# Patient Record
Sex: Female | Born: 1990 | Race: White | Hispanic: Yes | Marital: Married | Smoking: Former smoker
Health system: Southern US, Community
[De-identification: ages and names within clinical notes are randomized; demographics above are authoritative.]

## PROBLEM LIST (undated history)

## (undated) DIAGNOSIS — O99019 Anemia complicating pregnancy, unspecified trimester: Secondary | ICD-10-CM

## (undated) DIAGNOSIS — K219 Gastro-esophageal reflux disease without esophagitis: Secondary | ICD-10-CM

## (undated) DIAGNOSIS — K8020 Calculus of gallbladder without cholecystitis without obstruction: Secondary | ICD-10-CM

## (undated) HISTORY — PX: APPENDECTOMY: SHX54

---
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
## 2012-06-03 LAB — COMPREHENSIVE METABOLIC PANEL
Albumin: 4.3 g/dL (ref 3.8–5.6)
Alkaline Phosphatase: 102 U/L (ref 82–169)
Anion Gap: 8 (ref 7–16)
BUN: 9 mg/dL (ref 7–18)
Bilirubin,Total: 0.5 mg/dL (ref 0.2–1.0)
Chloride: 109 mmol/L — ABNORMAL HIGH (ref 98–107)
Co2: 24 mmol/L (ref 21–32)
Creatinine: 0.63 mg/dL (ref 0.60–1.30)
EGFR (African American): >60
EGFR (Non-African Amer.): >60
Glucose: 106 mg/dL — ABNORMAL HIGH (ref 65–99)
Potassium: 3.9 mmol/L (ref 3.5–5.1)
SGPT (ALT): 18 U/L (ref 12–78)
Sodium: 141 mmol/L (ref 136–145)
Total Protein: 8.7 g/dL — ABNORMAL HIGH (ref 6.4–8.6)

## 2012-06-03 LAB — CBC
HCT: 41.7 % (ref 35.0–47.0)
HGB: 14 g/dL (ref 12.0–16.0)
MCV: 85 fL (ref 80–100)
Platelet: 237 10*3/uL (ref 150–440)
RDW: 12.6 % (ref 11.5–14.5)

## 2012-06-03 LAB — URINALYSIS, COMPLETE
Glucose,UR: NEGATIVE mg/dL (ref 0–75)
Ph: 6 (ref 4.5–8.0)
Protein: 100
Specific Gravity: 1.027 (ref 1.003–1.030)
WBC UR: 32 /HPF (ref 0–5)

## 2012-06-03 LAB — LIPASE, BLOOD: Lipase: 86 U/L (ref 73–393)

## 2012-06-04 ENCOUNTER — Observation Stay: Payer: Self-pay | Admitting: Surgery

## 2012-06-07 LAB — PATHOLOGY REPORT

## 2012-11-02 ENCOUNTER — Emergency Department: Payer: Self-pay | Admitting: Emergency Medicine

## 2012-11-02 LAB — URINALYSIS, COMPLETE
Bilirubin,UR: NEGATIVE
Blood: NEGATIVE
Glucose,UR: NEGATIVE mg/dL (ref 0–75)
Ketone: NEGATIVE
Nitrite: NEGATIVE
Squamous Epithelial: 2
WBC UR: 9 /HPF (ref 0–5)

## 2012-11-02 LAB — CBC
HCT: 39.5 % (ref 35.0–47.0)
MCH: 28.7 pg (ref 26.0–34.0)
MCV: 86 fL (ref 80–100)
Platelet: 252 10*3/uL (ref 150–440)
RBC: 4.6 10*6/uL (ref 3.80–5.20)
WBC: 10.6 10*3/uL (ref 3.6–11.0)

## 2012-11-02 LAB — COMPREHENSIVE METABOLIC PANEL
Albumin: 4 g/dL (ref 3.8–5.6)
BUN: 11 mg/dL (ref 7–18)
Bilirubin,Total: 0.2 mg/dL (ref 0.2–1.0)
Calcium, Total: 9 mg/dL (ref 9.0–10.7)
Co2: 29 mmol/L (ref 21–32)
Glucose: 86 mg/dL (ref 65–99)
Osmolality: 274 (ref 275–301)
Potassium: 3.5 mmol/L (ref 3.5–5.1)
Sodium: 138 mmol/L (ref 136–145)
Total Protein: 7.8 g/dL (ref 6.4–8.6)

## 2012-11-02 LAB — LIPASE, BLOOD: Lipase: 178 U/L (ref 73–393)

## 2013-11-17 ENCOUNTER — Inpatient Hospital Stay: Payer: Self-pay | Admitting: Obstetrics and Gynecology

## 2013-11-17 LAB — CBC WITH DIFFERENTIAL/PLATELET
BASOS ABS: 0 10*3/uL (ref 0.0–0.1)
Basophil %: 0.3 %
Eosinophil #: 0 10*3/uL (ref 0.0–0.7)
Eosinophil %: 0.4 %
HCT: 38.8 % (ref 35.0–47.0)
HGB: 12.8 g/dL (ref 12.0–16.0)
LYMPHS ABS: 0.8 10*3/uL — AB (ref 1.0–3.6)
Lymphocyte %: 9.5 %
MCH: 28.6 pg (ref 26.0–34.0)
MCHC: 32.9 g/dL (ref 32.0–36.0)
MCV: 87 fL (ref 80–100)
Monocyte #: 0.6 x10 3/mm (ref 0.2–0.9)
Monocyte %: 6.5 %
NEUTROS ABS: 7.4 10*3/uL — AB (ref 1.4–6.5)
Neutrophil %: 83.3 %
Platelet: 157 10*3/uL (ref 150–440)
RBC: 4.47 10*6/uL (ref 3.80–5.20)
RDW: 13.7 % (ref 11.5–14.5)
WBC: 8.9 10*3/uL (ref 3.6–11.0)

## 2013-11-17 LAB — DRUG SCREEN, URINE

## 2013-11-17 LAB — GC/CHLAMYDIA PROBE AMP

## 2013-11-18 LAB — HEMATOCRIT: HCT: 33.5 % — AB (ref 35.0–47.0)

## 2014-08-04 NOTE — H&P (Signed)
Subjective/Chief Complaint 36 hours rlq pain   History of Present Illness 24 y/o female with gradual onset of rlq abdominal pain followed by nausea and emesis thereafter.  no sick contacts.  pain progressive over course of day, developed anorexia.   Past History 2 pregnancies   ALLERGIES:  No Known Allergies:    Other Allergies none   Family and Social History:  Family History Non-Contributory   Social History negative tobacco   Place of Living Home   Review of Systems:  Subjective/Chief Complaint see above   Abdominal Pain Yes   Nausea/Vomiting Yes   Tolerating Diet No  Nauseated  Vomiting   Medications/Allergies Reviewed Medications/Allergies reviewed   Physical Exam:  GEN no acute distress, thin   HEENT pale conjunctivae   NECK supple   RESP normal resp effort  clear BS   CARD regular rate   ABD positive tenderness  no hernia  soft  rovsings sign positive.   LYMPH negative neck   EXTR negative cyanosis/clubbing   SKIN normal to palpation   NEURO cranial nerves intact   PSYCH A+O to time, place, person   Lab Results: Hepatic:  20-Feb-14 13:59   Bilirubin, Total 0.5  Alkaline Phosphatase 102  SGPT (ALT) 18  SGOT (AST) 19  Total Protein, Serum  8.7  Albumin, Serum 4.3  Routine Chem:  20-Feb-14 13:59   Glucose, Serum  106  BUN 9  Creatinine (comp) 0.63  Sodium, Serum 141  Potassium, Serum 3.9  Chloride, Serum  109  CO2, Serum 24  Calcium (Total), Serum 9.3  Osmolality (calc) 280  eGFR (African American) >60  eGFR (Non-African American) >60 (eGFR values <20m/min/1.73 m2 may be an indication of chronic kidney disease (CKD). Calculated eGFR is useful in patients with stable renal function. The eGFR calculation will not be reliable in acutely ill patients when serum creatinine is changing rapidly. It is not useful in  patients on dialysis. The eGFR calculation may not be applicable to patients at the low and high extremes of body sizes,  pregnant women, and vegetarians.)  Anion Gap 8  Lipase 86 (Result(s) reported on 03 Jun 2012 at 02:22PM.)  Routine UA:  20-Feb-14 13:59   Color (UA) Amber  Clarity (UA) Cloudy  Glucose (UA) Negative  Bilirubin (UA) Negative  Ketones (UA) 2+  Specific Gravity (UA) 1.027  Blood (UA) 3+  pH (UA) 6.0  Protein (UA) 100 mg/dL  Nitrite (UA) Negative  Leukocyte Esterase (UA) 2+ (Result(s) reported on 03 Jun 2012 at 03:16PM.)  RBC (UA) 6 /HPF  WBC (UA) 32 /HPF  Bacteria (UA) TRACE  Epithelial Cells (UA) 13 /HPF  Mucous (UA) PRESENT (Result(s) reported on 03 Jun 2012 at 03:16PM.)  Routine Hem:  20-Feb-14 13:59   WBC (CBC)  20.7  RBC (CBC) 4.89  Hemoglobin (CBC) 14.0  Hematocrit (CBC) 41.7  Platelet Count (CBC) 237 (Result(s) reported on 03 Jun 2012 at 02:13PM.)  MCV 85  MCH 28.7  MCHC 33.7  RDW 12.6   Radiology Results: LabUnknown:    20-Feb-14 20:38, CT Abdomen and Pelvis With Contrast  PACS Image  CT:  CT Abdomen and Pelvis With Contrast  REASON FOR EXAM:    (1) RLQ PAIN; (2) RLQ PAIN  COMMENTS:       PROCEDURE: CT  - CT ABDOMEN / PELVIS  W  - Jun 03 2012  8:38PM     RESULT: CT abdomen pelvis dated 06/03/2012.    Technique: Helical 3 mm sections were obtained  from the lung bases   through the pubic symphysis status post intravenous administration of 85   mL Isovue-300 and oral contrast.    Findings: Lung bases are unremarkable. The liver, spleen, adrenals,   pancreas stomach kidneys are unremarkable. Calcified gallstones and   sludge is identified within the gallbladder. There is no CT evidence of   bowel obstruction.  A dilated fluid-filled appendix is identified within the right paracolic   gutter region extending anteriorly and inferiorly from the distal cecum.   And appendicoliths identified within the proximal appendix. There is   stranding within the periappendiceal fat and a trace amount of free   fluid. The appendix measures 1.4 cm in diameter. There is no  evidence of   associated loculated fluid collections.    IMPRESSION:  Findings consistent with appendicitis.        Verified By: Mikki Santee, M.D., MD    Assessment/Admission Diagnosis 24 y/o female with acute appendicitis abnormal UA but no dysuria.   Plan admit, lap appy, discussed with her and husband the procedure and all r/b/a discussed and all questions addressed. IV invanz.scd's   Electronic Signatures: Sherri Rad (MD)  (Signed 20-Feb-14 22:42)  Authored: CHIEF COMPLAINT and HISTORY, ALLERGIES, Other Allergies, FAMILY AND SOCIAL HISTORY, REVIEW OF SYSTEMS, PHYSICAL EXAM, LABS, Radiology, ASSESSMENT AND PLAN   Last Updated: 20-Feb-14 22:42 by Sherri Rad (MD)

## 2014-08-04 NOTE — Op Note (Signed)
PATIENT NAME:  Mary [name], Mary [name] MR#:  [id number] DATE OF BIRTH:  1990-09-12  DATE OF PROCEDURE:  06/04/2012  PREOPERATIVE DIAGNOSIS: Acute appendicitis.   POSTOPERATIVE DIAGNOSIS: Acute appendicitis.  PROCEDURE PERFORMED: Laparoscopic appendectomy.   SURGEON: Roshell Brigham A. Egbert GaribaldiBird, M.D.   ASSISTANT: Surgical Scrub Technologist   ANESTHESIA:  Endotracheal.   ANESTHESIOLOGIST:  Dr. [facility].   SPECIMENS: Appendix.   FINDINGS: Acute appendicitis.   ESTIMATED BLOOD LOSS: Minimal.   DESCRIPTION OF PROCEDURE: With the patient in the supine position, general oral endotracheal anesthesia was induced. Her abdomen was widely prepped and draped with ChloraPrep solution. The left arm was padded and tucked at her side. Timeout was observed. A 12 mm blunt Hassan trocar was placed through a transversely oriented skin incision with stay sutures being passed through the fascia. Pneumoperitoneum was established. A 5 mm bladeless trocar was placed in the right upper quadrant. The patient was then positioned in Trendelenburg and airplane right side up. A 5 mm bladeless trocar was placed in the suprapubic area near the midline. The appendix was then manipulated towards the anterior abdominal wall. The appendix was inflamed with patchy areas of gangrene at the tip. A window was fashioned in the mesoappendix with blunt technique. Utilizing an Endo GIA with bowel load application, the base of the appendix was transected with a lip of cecum.  The mesoappendix was then taken with a single fire of the white load of the stapler. Point hemostasis was obtained, at the staple line, with electrocautery. Specimen was captured in an Endo Catch device and retrieved. The right lower quadrant was then inspected for hemostasis, found to be adequate, irrigated with 500 mL of warm normal saline and aspirated dry. Generalized laparoscopic evaluation of abdomen demonstrated no other acute findings. Gallbladder  appeared to be normal as well as the liver.   Ports were then removed under direct visualization. The infraumbilical fascial defect was closed with an additional figure-of-eight #0 Vicryl suture in vertical orientation with the existing stay sutures tied to each other. A total of 30 mL of 0.25% plain Marcaine was infiltrated along all skin and fascial incisions prior to closure. 4-0 Vicryl subcuticular was applied to all skin edges followed by application of benzoin, Steri-Strips, Telfa and Tegaderm. The patient was then subsequently extubated and taken to the recovery room in stable and satisfactory condition by anesthesia services.   ____________________________ Redge GainerMark A. Egbert GaribaldiBird, MD mab:sb D: 06/04/2012 00:15:10 ET T: 06/04/2012 11:06:45 ET JOB#: [id number]  cc: Loraine LericheMark A. Egbert GaribaldiBird, MD, <Dictator> Raynald KempMARK A Autumn Pruitt MD ELECTRONICALLY SIGNED 06/04/2012 11:41

## 2014-08-22 NOTE — H&P (Signed)
L&D Evaluation:  History:  HPI 24 year old G4 P2012 with EDC=11/15/2013 by a 7wk1d ultrasound presents to L&D at 40 2/7 weeks with c/o onset contractions at 8AM today, becoming more intense at 0930 AM. Denies LOF or VB. [facility] for cholelithiasis (noted on 2014 ultrasound), being RH negative and receiving Rhogam at 28 weeks, having a negative first trimester screen and a normal anatomy scan, and for smoking marijuana during pregnancy. Had positive UDS early in pregnancy and then on 6/8, but then a UDS on 7/20 was negative. NST yesterday was reactive and AFI=7.03cm.  Received TDAP 6/8. LABS : O neg/ VI/RI/ GBS negative. Desires to breastfeed and use Depo for pp birth control.   Presents with contractions   Patient's Medical History Cholelithiasis   Patient's Surgical History appendectomy   Medications Pre Natal Vitamins   Allergies NKDA   Social History drugs  MJ   Family History Non-Contributory   ROS:  ROS see HPI   Exam:  Vital Signs stable  124/71   Urine Protein not completed   General breathing with contractions   Mental Status clear   Chest clear   Heart normal sinus rhythm, no murmur/gallop/rubs   Abdomen gravid, tender with contractions   Estimated Fetal Weight Average for gestational age, 7#   Fetal Position cephalic   Edema no edema   Pelvic no external lesions, 6/80%/-1 to 0   Mebranes Intact   FHT normal rate with no decels, 140 baseline with accels to 170s   FHT Description moderate variability   Ucx regular, q1-5 min apart, inconsistent duration and intensity   Skin dry   Impression:  Impression IUP at 40 2/7 weeks in active labor. 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   Plan:  Plan EFM/NST, monitor contractions and for cervical change, Stadol for pain. Desires epidural if time allows.  UDS   Electronic Signatures: Trinna BalloonGutierrez, Dontrey Snellgrove L (CNM)  (Signed 06-Aug-15 13:14)  Authored: L&D Evaluation   Last Updated: 06-Aug-15 13:14 by  Trinna BalloonGutierrez, Rashad Obeid L (CNM)

## 2014-09-30 ENCOUNTER — Emergency Department: Payer: Medicaid Other

## 2014-09-30 ENCOUNTER — Emergency Department
Admission: EM | Admit: 2014-09-30 | Discharge: 2014-09-30 | Disposition: A | Discharge status: Home / Self Care | Payer: Medicaid Other | Attending: Emergency Medicine | Admitting: Emergency Medicine

## 2014-09-30 ENCOUNTER — Encounter: Payer: Self-pay | Admitting: Emergency Medicine

## 2014-09-30 DIAGNOSIS — O26611 Liver and biliary tract disorders in pregnancy, first trimester: Secondary | ICD-10-CM

## 2014-09-30 DIAGNOSIS — O99611 Diseases of the digestive system complicating pregnancy, first trimester: Secondary | ICD-10-CM | POA: Diagnosis not present

## 2014-09-30 DIAGNOSIS — Z3A01 Less than 8 weeks gestation of pregnancy: Secondary | ICD-10-CM | POA: Diagnosis not present

## 2014-09-30 DIAGNOSIS — R109 Unspecified abdominal pain: Secondary | ICD-10-CM

## 2014-09-30 DIAGNOSIS — R102 Pelvic and perineal pain: Secondary | ICD-10-CM

## 2014-09-30 DIAGNOSIS — O2341 Unspecified infection of urinary tract in pregnancy, first trimester: Secondary | ICD-10-CM | POA: Insufficient documentation

## 2014-09-30 DIAGNOSIS — K8020 Calculus of gallbladder without cholecystitis without obstruction: Secondary | ICD-10-CM | POA: Diagnosis not present

## 2014-09-30 DIAGNOSIS — O9989 Other specified diseases and conditions complicating pregnancy, childbirth and the puerperium: Secondary | ICD-10-CM | POA: Diagnosis present

## 2014-09-30 HISTORY — DX: Calculus of gallbladder without cholecystitis without obstruction: K80.20

## 2014-09-30 LAB — COMPREHENSIVE METABOLIC PANEL
ALT: 13 U/L — ABNORMAL LOW (ref 14–54)
AST: 19 U/L (ref 15–41)
Albumin: 4.5 g/dL (ref 3.5–5.0)
Alkaline Phosphatase: 69 U/L (ref 38–126)
Anion gap: 6 (ref 5–15)
BILIRUBIN TOTAL: 0.4 mg/dL (ref 0.3–1.2)
BUN: 7 mg/dL (ref 6–20)
CHLORIDE: 109 mmol/L (ref 101–111)
CO2: 23 mmol/L (ref 22–32)
Calcium: 9.2 mg/dL (ref 8.9–10.3)
Creatinine, Ser: 0.56 mg/dL (ref 0.44–1.00)
GFR calc Af Amer: >60 mL/min (ref >60)
GFR calc non Af Amer: >60 mL/min (ref >60)
Glucose, Bld: 100 mg/dL — ABNORMAL HIGH (ref 65–99)
Potassium: 3.6 mmol/L (ref 3.5–5.1)
SODIUM: 138 mmol/L (ref 135–145)
TOTAL PROTEIN: 7.8 g/dL (ref 6.5–8.1)

## 2014-09-30 LAB — CBC
HCT: 41.6 % (ref 35.0–47.0)
Hemoglobin: 13.5 g/dL (ref 12.0–16.0)
MCH: 28.7 pg (ref 26.0–34.0)
MCHC: 32.4 g/dL (ref 32.0–36.0)
MCV: 88.6 fL (ref 80.0–100.0)
Platelets: 226 10*3/uL (ref 150–440)
RBC: 4.7 MIL/uL (ref 3.80–5.20)
RDW: 12.2 % (ref 11.5–14.5)
WBC: 14 10*3/uL — ABNORMAL HIGH (ref 3.6–11.0)

## 2014-09-30 LAB — URINALYSIS COMPLETE WITH MICROSCOPIC (ARMC ONLY)
Bilirubin Urine: NEGATIVE
GLUCOSE, UA: NEGATIVE mg/dL
Hgb urine dipstick: NEGATIVE
Nitrite: POSITIVE — AB
Protein, ur: 30 mg/dL — AB
Specific Gravity, Urine: 1.015 (ref 1.005–1.030)
pH: 9 — ABNORMAL HIGH (ref 5.0–8.0)

## 2014-09-30 LAB — HCG, QUANTITATIVE, PREGNANCY: hCG, Beta Chain, Quant, S: 89159 m[IU]/mL — ABNORMAL HIGH (ref <5)

## 2014-09-30 LAB — LIPASE, BLOOD: LIPASE: 27 U/L (ref 22–51)

## 2014-09-30 MED ORDER — METOCLOPRAMIDE HCL 5 MG/ML IJ SOLN
INTRAMUSCULAR | Status: AC
  Filled 2014-09-30: qty 2

## 2014-09-30 MED ORDER — METOCLOPRAMIDE HCL 5 MG/ML IJ SOLN
10.0000 mg | Freq: Once | INTRAMUSCULAR | Status: AC
  Administered 2014-09-30: 10 mg via INTRAVENOUS

## 2014-09-30 MED ORDER — METOCLOPRAMIDE HCL 10 MG PO TABS: 10.0000 mg | ORAL_TABLET | Freq: Three times a day (TID) | ORAL | Status: DC

## 2014-09-30 MED ORDER — NITROFURANTOIN MONOHYD MACRO 100 MG PO CAPS: 100.0000 mg | ORAL_CAPSULE | Freq: Two times a day (BID) | ORAL | Status: AC

## 2014-09-30 MED ORDER — SODIUM CHLORIDE 0.9 % IV BOLUS (SEPSIS)
1000.0000 mL | Freq: Once | INTRAVENOUS | Status: AC
  Administered 2014-09-30: 1000 mL via INTRAVENOUS

## 2014-09-30 NOTE — Discharge Instructions (Signed)

## 2014-09-30 NOTE — ED Notes (Signed)
Patient states she is currently pregnant, unsure of how many weeks but knows LMP was in early May. Patient states she has h/o gall stones. Has had pain at RUQ abdomen with N/V. Has not had OBGYN appt yet.

## 2014-09-30 NOTE — ED Provider Notes (Signed)
[facility] Emergency Department Provider Note ____________________________________________  Time seen: Approximately 3:47 PM  I have reviewed the triage vital signs and the nursing notes.   HISTORY  Chief Complaint Abdominal Pain and Morning Sickness   HPI Mary [name] is a 24 y.o. female who presents to the emergency department for right upper quadrant abdominal pain. She also states that she is pregnant but she is not sure how many weeks. Her last menstrual cycle was sometime in early May. She has a history of gallstones. She states that she is unable to keep any food on her stomach and vomits after eating.   Past Medical History  Diagnosis Date  . Gallstones     There are no active problems to display for this patient.   Past Surgical History  Procedure Laterality Date  . Appendectomy      Current Outpatient Rx  Name  Route  Sig  Dispense  Refill  . metoCLOPramide (REGLAN) 10 MG tablet   Oral   Take 1 tablet (10 mg total) by mouth 3 (three) times daily with meals.   42 tablet   1   . nitrofurantoin, macrocrystal-monohydrate, (MACROBID) 100 MG capsule   Oral   Take 1 capsule (100 mg total) by mouth 2 (two) times daily.   14 capsule   0     Allergies Review of patient's allergies indicates no known allergies.  No family history on file.  Social History History  Substance Use Topics  . Smoking status: Never Smoker   . Smokeless tobacco: Never Used  . Alcohol Use: No    Review of Systems Constitutional: No fever/chills Eyes: No visual changes. ENT: No sore throat. Cardiovascular: Denies chest pain. Respiratory: Denies shortness of breath. Gastrointestinal: Positive for abdominal pain.  Positive for nausea and vomiting.  No diarrhea.  No constipation. Genitourinary: Negative for dysuria. Musculoskeletal: Negative for back pain. Skin: Negative for rash. Neurological: Negative for headaches, focal weakness or  numbness.  10-point ROS otherwise negative.  ____________________________________________   PHYSICAL EXAM:  VITAL SIGNS: ED Triage Vitals  Enc Vitals Group     BP 09/30/14 1257 104/50 mmHg     Pulse Rate 09/30/14 1257 78     Resp 09/30/14 1257 18     Temp 09/30/14 1257 98.6 F (37 C)     Temp Source 09/30/14 1257 Oral     SpO2 09/30/14 1257 100 %     Weight 09/30/14 1257 120 lb (54.432 kg)     Height 09/30/14 1257 4\' 9"  (1.448 m)     Head Cir --      Peak Flow --      Pain Score 09/30/14 1258 6     Pain Loc --      Pain Edu? --      Excl. in GC? --     Constitutional: Alert and oriented. Well appearing and in no acute distress. Eyes: Conjunctivae are normal. PERRL. EOMI. Head: Atraumatic. Nose: No congestion/rhinnorhea. Mouth/Throat: Mucous membranes are moist.  Oropharynx non-erythematous. Neck: No stridor.   Cardiovascular: Normal rate, regular rhythm. Grossly normal heart sounds.  Good peripheral circulation. Respiratory: Normal respiratory effort.  No retractions. Lungs CTAB. Gastrointestinal: Soft and tender over RUQ. No distention. No abdominal bruits. No CVA tenderness. Genitourinary: Exam deferred. Musculoskeletal: No lower extremity tenderness nor edema.  No joint effusions. Neurologic:  Normal speech and language. No gross focal neurologic deficits are appreciated. Speech is normal. No gait instability. Skin:  Skin is warm, dry  and intact. No rash noted. Psychiatric: Mood and affect are normal. Speech and behavior are normal.  ____________________________________________   LABS (all labs ordered are listed, but only abnormal results are displayed)  Labs Reviewed  HCG, QUANTITATIVE, PREGNANCY - Abnormal; Notable for the following:    hCG, Beta Chain, Quant, [place] [address] (*)    All other components within normal limits  CBC - Abnormal; Notable for the following:    WBC 14.0 (*)    All other components within normal limits  COMPREHENSIVE METABOLIC PANEL -  Abnormal; Notable for the following:    Glucose, Bld 100 (*)    ALT 13 (*)    All other components within normal limits  URINALYSIS COMPLETEWITH MICROSCOPIC ([facility] ONLY) - Abnormal; Notable for the following:    Color, Urine YELLOW (*)    APPearance CLOUDY (*)    Ketones, ur TRACE (*)    pH 9.0 (*)    Protein, ur 30 (*)    Nitrite POSITIVE (*)    Leukocytes, UA 3+ (*)    Bacteria, UA FEW (*)    Squamous Epithelial / LPF 6-30 (*)    All other components within normal limits  LIPASE, BLOOD   ____________________________________________  EKG   ____________________________________________  RADIOLOGY  Ultrasound shows Cholelithiasis without acute cholecystitis. Single live IUP approximately 6 weeks and 2 days. ____________________________________________   PROCEDURES  Procedure(s) performed: None  Critical Care performed: No  ____________________________________________   INITIAL IMPRESSION / ASSESSMENT AND PLAN / ED COURSE  Pertinent labs & imaging results that were available during my care of the patient were reviewed by me and considered in my medical decision making (see chart for details).  Initial Impression: Cholelithiasis; Pregnancy 1st trimester. Will medicate and do [place] for pregnancy and gallbladder disease.  ----------------------------------------- 6:18 PM on 09/30/2014 -----------------------------------------  Patient is pain-free after Reglan. She was advised of the ultrasound findings. She was advised to follow-up with the OB/GYN of her choice for prenatal care. She was advised to return to the emergency department for symptoms that change or worsen if simple schedule appointment. ____________________________________________   FINAL CLINICAL IMPRESSION(S) / ED DIAGNOSES  Final diagnoses:  Abdominal pain  Urinary tract infection during pregnancy, first trimester  Cholelithiasis affecting pregnancy in first trimester, antepartum      Chinita Pester, FNP 09/30/14 1819  Sharman Cheek, MD 10/01/14 [phone]

## 2014-09-30 NOTE — ED Notes (Signed)
Pt sleeping. 

## 2014-10-22 LAB — OB RESULTS CONSOLE GC/CHLAMYDIA
Chlamydia: NEGATIVE
Gonorrhea: NEGATIVE

## 2014-10-22 LAB — OB RESULTS CONSOLE HGB/HCT, BLOOD: Hemoglobin: 13.3 g/dL

## 2014-10-24 HISTORY — PX: LAPAROSCOPIC CHOLECYSTECTOMY: SUR755

## 2014-12-14 LAB — OB RESULTS CONSOLE ABO/RH: RH TYPE: NEGATIVE

## 2014-12-14 LAB — OB RESULTS CONSOLE ANTIBODY SCREEN: Antibody Screen: NEGATIVE

## 2015-02-28 LAB — OB RESULTS CONSOLE HIV ANTIBODY (ROUTINE TESTING): HIV: NONREACTIVE

## 2015-04-15 NOTE — L&D Delivery Note (Signed)
Obstetrical Delivery Note   Date of Delivery:   05/16/2015 Primary OB:   [facility] Gestational Age/EDD: [redacted]w[redacted]d (Dated by10 wk ultrasound) Antepartum complications: late prenatal care, inadequate prenatal care, cholecystectomy at 10 weeks, close inter conceptual spacing  Delivered By:   Farrel Conners, CNM  Delivery Type:   spontaneous vaginal delivery  Procedure Details:   CTSP as cervix was C/C and baby +2 station. Mother was instructed on pushing and delivered a vigorous female infant over and intact perinuem. Baby placed on mother's abdomen and after delayed cord clamping, FOB cut cord. Baby placed skin to skin on mother. Anesthesia:    epidural Intrapartum complications: None GBS:    negative Laceration:    none Episiotomy:    none Placenta:    Via active 3rd stage. To pathology: no Estimated Blood Loss:  350 ml  Baby:    Liveborn female, Apgars 8/9, weight pending    Mary [name], CNM

## 2015-05-08 ENCOUNTER — Observation Stay
Admit: 2015-05-08 | Discharge: 2015-05-09 | Disposition: A | Discharge status: Home / Self Care | Payer: Medicaid Other | Attending: Obstetrics and Gynecology | Admitting: Obstetrics and Gynecology

## 2015-05-08 DIAGNOSIS — O0933 Supervision of pregnancy with insufficient antenatal care, third trimester: Secondary | ICD-10-CM | POA: Insufficient documentation

## 2015-05-08 DIAGNOSIS — Z3A33 33 weeks gestation of pregnancy: Secondary | ICD-10-CM | POA: Insufficient documentation

## 2015-05-08 DIAGNOSIS — O479 False labor, unspecified: Secondary | ICD-10-CM | POA: Diagnosis present

## 2015-05-08 HISTORY — DX: Gastro-esophageal reflux disease without esophagitis: K21.9

## 2015-05-09 DIAGNOSIS — O479 False labor, unspecified: Secondary | ICD-10-CM | POA: Diagnosis present

## 2015-05-09 DIAGNOSIS — O0933 Supervision of pregnancy with insufficient antenatal care, third trimester: Secondary | ICD-10-CM | POA: Diagnosis not present

## 2015-05-09 DIAGNOSIS — Z3A33 33 weeks gestation of pregnancy: Secondary | ICD-10-CM | POA: Diagnosis not present

## 2015-05-09 LAB — CBC
HCT: 35.5 % (ref 35.0–47.0)
Hemoglobin: 11.7 g/dL — ABNORMAL LOW (ref 12.0–16.0)
MCH: 28.6 pg (ref 26.0–34.0)
MCHC: 33.1 g/dL (ref 32.0–36.0)
MCV: 86.5 fL (ref 80.0–100.0)
PLATELETS: 168 10*3/uL (ref 150–440)
RBC: 4.1 MIL/uL (ref 3.80–5.20)
RDW: 13.3 % (ref 11.5–14.5)
WBC: 9.9 10*3/uL (ref 3.6–11.0)

## 2015-05-09 LAB — OB RESULTS CONSOLE GBS: GBS: NEGATIVE

## 2015-05-09 MED ORDER — ACETAMINOPHEN 325 MG PO TABS: 650.0000 mg | ORAL_TABLET | ORAL | Status: DC | PRN

## 2015-05-09 MED ORDER — LACTATED RINGERS IV SOLN
INTRAVENOUS | Status: DC
  Administered 2015-05-09: via INTRAVENOUS

## 2015-05-09 NOTE — H&P (Addendum)
Obstetric H&P   Chief Complaint: Contraction   Prenatal Care Provider: [facility] then transfer to [facility] with no care since 33 weeks  History of Present Illness: 25 y.o. Mary [name]  With EDC of 05/23/2015 presenting with post-coital contractions via EMS.  Patient is 4cm dilated on presentation contracting every 2 minutes.  +FM, no LOF, no VB.  Very limited prenatal care initially seen at [facility] where she had normal anatomy scan and fetal echocardiogram.  She was last seen on 04/05/2015 at 33 weeks 1 day gestation.  REceived rhogam and TDAP on 03/22/15. THC positive UDS this pregnancy.  Close interval pregnancy  O negative / ABSC negative / RPR NR / HIV negative / 1-hr OGTT 85 / GBS unknown.  Started care at [facility] I currently do not have [facility] records to review so no rubella, varicella, or HBsAg lab results to review will see if able to obtain from [facility]  Review of Systems: 10 point review of systems negative unless otherwise noted in HPI  Past Medical History: Past Medical History  Diagnosis Date  . Gallstones     Past Surgical History: Past Surgical History  Procedure Laterality Date  . Appendectomy      Family History: No family history on file.  Social History: Social History   Social History  . Marital Status: Married    Spouse Name: N/A  . Number of Children: N/A  . Years of Education: N/A   Occupational History  . Not on file.   Social History Main Topics  . Smoking status: Never Smoker   . Smokeless tobacco: Never Used  . Alcohol Use: No  . Drug Use: Not on file  . Sexual Activity: Not on file   Other Topics Concern  . Not on file   Social History Narrative    Medications: Prior to Admission medications   Medication Sig Start Date End Date Taking? Authorizing Provider  metoCLOPramide (REGLAN) 10 MG tablet Take 1 tablet (10 mg total) by mouth 3 (three) times daily with meals. 09/30/14 09/30/15  Chinita Pester, FNP    Allergies: No Known Allergies  Physical  Exam: Vitals: Blood pressure 112/67, pulse 86, temperature 98.6 F (37 C), resp. rate 18.   FHT:145, moderate variability, +accels, no decels Toco: q3min  General: NAD HEENT: normocephalic, anicteric Pulmonary: no increased work of breathing Cardiovascular: RRR Abdomen: Gravid, non-tender Leopolds: vtx Genitourinary: 4cm on admission per nursing staff Extremities: no edema  Labs: No results found for this or any previous visit (from the past 24 hour(s)).  Assessment: 25 y.o. [id number] presenting with postcoital contractions  Plan: 1) R/O labor - start IV and get CBC, recheck in 1-hr if unchanged extended rule out given frequency of contraction  2) Fetus - category I tracing, reactive NST/negative contraction stress test  3) Limited prenatal care - prior THC positive UDS this pregnancy - UDS sent on admission - will attempt to get [facility] records in AM, otherwise will need rubella, varicella, and HBsAg drawn if delivers  4) PNL O negative / ABSC negative / RPR NR / HIV negative / 1-hr OGTT 85 / GBS unknown - no GBS coverage per 2010 CDC "Guidlines for the Prevention of Perinatal Group B Streptococcal Disease" risk based screening approach for GBS unknown.   5) TDAP - up to date  6) Disposition - pending rule in or rule out  Addendum [facility] records reviewed  HBsAg negative, UDS THC positive at [facility] as well, family history of HIV father HIV positive, Rubella  Immune, Varicella Immune.  Plans to breastfeed and depo provera for postpartum contraception.  Pelvis tested to 8lbs 7oz.

## 2015-05-09 NOTE — Progress Notes (Signed)
On initial recheck 1-hr cervix remains unchanged 4/50-3.  Given frequency of contractions and multiparous status will keep for extended rule out and recheck in 2-hrs

## 2015-05-09 NOTE — Final Progress Note (Signed)
Physician Final Progress Note  Patient ID: Mary [name] MRN: [id number] DOB/AGE: [birth year] 22 y.o.  Admit date: 05/08/2015 Admitting provider: Vena Austria, MD Discharge date: 05/09/2015   Admission Diagnoses: Contractions    Discharge Diagnoses:  Active Problems:   Irregular contractions  Braxton hick contractions   Consults: None  Significant Findings/ Diagnostic Studies: reactive NST, negative contraction stress test  Procedures: NST  Discharge Condition: good  Disposition: 01-Home or Self Care  Diet: Regular diet  Discharge Activity: Activity as tolerated  Discharge Instructions    Discharge activity:  No Restrictions    Complete by:  As directed      Discharge diet:  No restrictions    Complete by:  As directed      Fetal Kick Count:  Lie on our left side for one hour after a meal, and count the number of times your baby kicks.  If it is less than 5 times, get up, move around and drink some juice.  Repeat the test 30 minutes later.  If it is still less than 5 kicks in an hour, notify your doctor.    Complete by:  As directed      LABOR:  When conractions begin, you should start to time them from the beginning of one contraction to the beginning  of the next.  When contractions are 5 - 10 minutes apart or less and have been regular for at least an hour, you should call your health care provider.    Complete by:  As directed      No sexual activity restrictions    Complete by:  As directed      Notify physician for bleeding from the vagina    Complete by:  As directed      Notify physician for blurring of vision or spots before the eyes    Complete by:  As directed      Notify physician for chills or fever    Complete by:  As directed      Notify physician for fainting spells, "black outs" or loss of consciousness    Complete by:  As directed      Notify physician for increase in vaginal discharge    Complete by:  As directed      Notify physician  for leaking of fluid    Complete by:  As directed      Notify physician for pain or burning when urinating    Complete by:  As directed      Notify physician for pelvic pressure (sudden increase)    Complete by:  As directed      Notify physician for severe or continued nausea or vomiting    Complete by:  As directed      Notify physician for sudden gushing of fluid from the vagina (with or without continued leaking)    Complete by:  As directed      Notify physician for sudden, constant, or occasional abdominal pain    Complete by:  As directed      Notify physician if baby moving less than usual    Complete by:  As directed             Medication List    ASK your doctor about these medications        metoCLOPramide 10 MG tablet  Commonly known as:  REGLAN  Take 1 tablet (10 mg total) by mouth 3 (three) times daily with meals.  Total time spent taking care of this patient: 60 minutes  Signed: Lorrene Reid 05/09/2015, 3:24 AM

## 2015-05-16 ENCOUNTER — Inpatient Hospital Stay: Payer: Medicaid Other | Admitting: Certified Registered Nurse Anesthetist

## 2015-05-16 ENCOUNTER — Encounter: Payer: Self-pay | Admitting: *Deleted

## 2015-05-16 ENCOUNTER — Inpatient Hospital Stay
Admission: AD | Admit: 2015-05-16 | Discharge: 2015-05-17 | DRG: 775 | Disposition: A | Discharge status: Home / Self Care | Payer: Medicaid Other | Attending: Certified Nurse Midwife | Admitting: Certified Nurse Midwife

## 2015-05-16 DIAGNOSIS — O99324 Drug use complicating childbirth: Secondary | ICD-10-CM | POA: Diagnosis present

## 2015-05-16 DIAGNOSIS — O0930 Supervision of pregnancy with insufficient antenatal care, unspecified trimester: Secondary | ICD-10-CM

## 2015-05-16 DIAGNOSIS — F1290 Cannabis use, unspecified, uncomplicated: Secondary | ICD-10-CM | POA: Diagnosis present

## 2015-05-16 DIAGNOSIS — Z9049 Acquired absence of other specified parts of digestive tract: Secondary | ICD-10-CM

## 2015-05-16 DIAGNOSIS — Z3A39 39 weeks gestation of pregnancy: Secondary | ICD-10-CM

## 2015-05-16 HISTORY — DX: Anemia complicating pregnancy, unspecified trimester: O99.019

## 2015-05-16 LAB — CHLAMYDIA/NGC RT PCR (ARMC ONLY)
Chlamydia Tr: NOT DETECTED
N GONORRHOEAE: NOT DETECTED

## 2015-05-16 LAB — TYPE AND SCREEN
ABO/RH(D): O NEG
ANTIBODY SCREEN: POSITIVE

## 2015-05-16 LAB — CBC
HCT: 34.8 % — ABNORMAL LOW (ref 35.0–47.0)
HEMOGLOBIN: 11.7 g/dL — AB (ref 12.0–16.0)
MCH: 28.8 pg (ref 26.0–34.0)
MCHC: 33.5 g/dL (ref 32.0–36.0)
MCV: 85.9 fL (ref 80.0–100.0)
PLATELETS: 147 10*3/uL — AB (ref 150–440)
RBC: 4.05 MIL/uL (ref 3.80–5.20)
RDW: 13.4 % (ref 11.5–14.5)
WBC: 7 10*3/uL (ref 3.6–11.0)

## 2015-05-16 LAB — ABO/RH: ABO/RH(D): O NEG

## 2015-05-16 MED ORDER — FERROUS SULFATE 325 (65 FE) MG PO TABS
325.0000 mg | ORAL_TABLET | Freq: Every day | ORAL | Status: DC
  Administered 2015-05-17: 325 mg via ORAL
  Filled 2015-05-16: qty 1

## 2015-05-16 MED ORDER — ONDANSETRON HCL 4 MG PO TABS: 4.0000 mg | ORAL_TABLET | ORAL | Status: DC | PRN

## 2015-05-16 MED ORDER — LANOLIN HYDROUS EX OINT: TOPICAL_OINTMENT | CUTANEOUS | Status: DC | PRN

## 2015-05-16 MED ORDER — DIBUCAINE 1 % RE OINT: 1.0000 "application " | TOPICAL_OINTMENT | RECTAL | Status: DC | PRN

## 2015-05-16 MED ORDER — ONDANSETRON HCL 4 MG/2ML IJ SOLN: 4.0000 mg | INTRAMUSCULAR | Status: DC | PRN

## 2015-05-16 MED ORDER — LIDOCAINE-EPINEPHRINE (PF) 1.5 %-1:200000 IJ SOLN
INTRAMUSCULAR | Status: DC | PRN
  Administered 2015-05-16: 3 mL via PERINEURAL

## 2015-05-16 MED ORDER — LACTATED RINGERS IV SOLN
500.0000 mL | INTRAVENOUS | Status: DC | PRN
  Administered 2015-05-16: 500 mL via INTRAVENOUS

## 2015-05-16 MED ORDER — WITCH HAZEL-GLYCERIN EX PADS: 1.0000 "application " | MEDICATED_PAD | CUTANEOUS | Status: DC | PRN

## 2015-05-16 MED ORDER — ONDANSETRON HCL 4 MG/2ML IJ SOLN
4.0000 mg | Freq: Four times a day (QID) | INTRAMUSCULAR | Status: DC | PRN
  Administered 2015-05-16: 4 mg via INTRAVENOUS
  Filled 2015-05-16: qty 2

## 2015-05-16 MED ORDER — AMMONIA AROMATIC IN INHA
0.3000 mL | Freq: Once | RESPIRATORY_TRACT | Status: DC | PRN
  Filled 2015-05-16: qty 10

## 2015-05-16 MED ORDER — FENTANYL 2.5 MCG/ML W/ROPIVACAINE 0.2% IN NS 100 ML EPIDURAL INFUSION (ARMC-ANES)
EPIDURAL | Status: AC
  Administered 2015-05-16: 10 mL/h via EPIDURAL
  Filled 2015-05-16: qty 100

## 2015-05-16 MED ORDER — BENZOCAINE-MENTHOL 20-0.5 % EX AERO: 1.0000 "application " | INHALATION_SPRAY | CUTANEOUS | Status: DC | PRN

## 2015-05-16 MED ORDER — PRENATAL MULTIVITAMIN CH: 1.0000 | ORAL_TABLET | Freq: Every day | ORAL | Status: DC

## 2015-05-16 MED ORDER — DOCUSATE SODIUM 100 MG PO CAPS
100.0000 mg | ORAL_CAPSULE | Freq: Every day | ORAL | Status: DC
  Administered 2015-05-17: 100 mg via ORAL
  Filled 2015-05-16: qty 1

## 2015-05-16 MED ORDER — OXYTOCIN 40 UNITS IN LACTATED RINGERS INFUSION - SIMPLE MED
1.0000 m[IU]/min | INTRAVENOUS | Status: DC
  Administered 2015-05-16: 1 m[IU]/min via INTRAVENOUS
  Filled 2015-05-16: qty 1000

## 2015-05-16 MED ORDER — SIMETHICONE 80 MG PO CHEW: 80.0000 mg | CHEWABLE_TABLET | ORAL | Status: DC | PRN

## 2015-05-16 MED ORDER — TERBUTALINE SULFATE 1 MG/ML IJ SOLN: 0.2500 mg | Freq: Once | INTRAMUSCULAR | Status: DC | PRN

## 2015-05-16 MED ORDER — BUPIVACAINE HCL (PF) 0.25 % IJ SOLN
INTRAMUSCULAR | Status: DC | PRN
  Administered 2015-05-16 (×2): 4 mL via EPIDURAL

## 2015-05-16 MED ORDER — OXYTOCIN 40 UNITS IN LACTATED RINGERS INFUSION - SIMPLE MED
2.5000 [IU]/h | INTRAVENOUS | Status: DC
  Administered 2015-05-16: 39.96 [IU]/h via INTRAVENOUS

## 2015-05-16 MED ORDER — HYDROCODONE-ACETAMINOPHEN 5-325 MG PO TABS
1.0000 | ORAL_TABLET | ORAL | Status: DC | PRN
  Administered 2015-05-16 (×2): 1 via ORAL
  Administered 2015-05-17: 2 via ORAL
  Filled 2015-05-16 (×2): qty 2
  Filled 2015-05-16: qty 1

## 2015-05-16 MED ORDER — BUTORPHANOL TARTRATE 1 MG/ML IJ SOLN: 1.0000 mg | INTRAMUSCULAR | Status: DC | PRN

## 2015-05-16 MED ORDER — LACTATED RINGERS IV SOLN
INTRAVENOUS | Status: DC
  Administered 2015-05-16: 09:00:00 via INTRAVENOUS

## 2015-05-16 MED ORDER — LIDOCAINE HCL (PF) 1 % IJ SOLN
30.0000 mL | INTRAMUSCULAR | Status: DC | PRN
  Filled 2015-05-16: qty 30

## 2015-05-16 MED ORDER — OXYTOCIN BOLUS FROM INFUSION: 500.0000 mL | INTRAVENOUS | Status: DC

## 2015-05-16 MED ORDER — MISOPROSTOL 200 MCG PO TABS
800.0000 ug | ORAL_TABLET | Freq: Once | ORAL | Status: DC | PRN
  Filled 2015-05-16: qty 4

## 2015-05-16 MED ORDER — IBUPROFEN 600 MG PO TABS
600.0000 mg | ORAL_TABLET | Freq: Four times a day (QID) | ORAL | Status: DC
  Administered 2015-05-16 – 2015-05-17 (×3): 600 mg via ORAL
  Filled 2015-05-16 (×3): qty 1

## 2015-05-16 NOTE — Anesthesia Procedure Notes (Signed)
Epidural Patient location during procedure: OB Start time: 05/16/2015 12:05 PM End time: 05/16/2015 12:27 PM  Staffing Anesthesiologist: Lenard Simmer Resident/CRNA: Ginger Carne Performed by: resident/CRNA   Preanesthetic Checklist Completed: patient identified, site marked, surgical consent, pre-op evaluation, timeout performed, IV checked, risks and benefits discussed and monitors and equipment checked  Epidural Patient position: sitting Prep: Betadine Patient monitoring: continuous pulse ox and blood pressure Approach: midline Location: L4-L5 Injection technique: LOR saline  Needle:  Needle type: Tuohy  Needle gauge: 18 G Needle length: 9 cm and 9 Needle insertion depth: 6 cm Catheter type: closed end flexible Catheter size: 20 Guage Catheter at skin depth: 12 cm Test dose: negative and 1.5% lidocaine with Epi 1:200 K  Assessment Sensory level: T8 Events: blood not aspirated, injection not painful, no injection resistance, negative IV test and no paresthesia  Additional Notes   Patient tolerated the insertion well without complications.Reason for block:procedure for pain

## 2015-05-16 NOTE — Lactation Note (Signed)
Lactation Consultation Note  Patient Name: Mary [name] Today's Date: 05/16/2015     Maternal Data  Mother's records show that she is positive for marijuana. Mother admits to using in the past 3wks.   Feeding  Mother has been instructed to "pump and dump" breastmilk every 3hrs to establish a breast milk supply. If mother still desires to breastfeed baby after a negative drug screen result, she can schedule an o/pt consult with Lactation.   [facility] Score/Interventions                      Lactation Tools Discussed/Used     Consult Status      Mary [name] 05/16/2015, 1:12 PM

## 2015-05-16 NOTE — Anesthesia Preprocedure Evaluation (Signed)
Anesthesia Evaluation  Patient identified by MRN, date of birth, ID band Patient awake    Reviewed: Allergy & Precautions, H&P , Patient's Chart, lab work & pertinent test results  History of Anesthesia Complications Negative for: history of anesthetic complications  Airway Mallampati: II  TM Distance: >3 FB Neck ROM: full  Mouth opening: Limited Mouth Opening  Dental  (+) Teeth Intact   Pulmonary neg pulmonary ROS,    Pulmonary exam normal breath sounds clear to auscultation       Cardiovascular Exercise Tolerance: Good negative cardio ROS Normal cardiovascular exam Rhythm:regular Rate:Normal     Neuro/Psych    GI/Hepatic Neg liver ROS, GERD  ,  Endo/Other  negative endocrine ROS  Renal/GU negative Renal ROS     Musculoskeletal   Abdominal   Peds  Hematology  (+) anemia ,   Anesthesia Other Findings   Reproductive/Obstetrics (+) Pregnancy                             Anesthesia Physical Anesthesia Plan  ASA: II  Anesthesia Plan: Epidural   Post-op Pain Management:    Induction:   Airway Management Planned:   Additional Equipment:   Intra-op Plan:   Post-operative Plan:   Informed Consent: I have reviewed the patients History and Physical, chart, labs and discussed the procedure including the risks, benefits and alternatives for the proposed anesthesia with the patient or authorized representative who has indicated his/her understanding and acceptance.     Plan Discussed with: Anesthesiologist  Anesthesia Plan Comments:         Anesthesia Quick Evaluation

## 2015-05-16 NOTE — Discharge Summary (Signed)
Obstetric Discharge Summary Reason for Admission: induction of labor for advanced cervical dilation Prenatal Procedures: ultrasound and fetal echo, TDAP, Rhogam Intrapartum Procedures: spontaneous vaginal delivery, Pitocin induction Postpartum Procedures: none Complications-Operative and Postpartum: none HEMOGLOBIN  Date Value Ref Range Status  05/17/2015 10.7* 12.0 - 16.0 g/dL Final  16/01/9603 54.0 g/dL Final   HGB  Date Value Ref Range Status  11/17/2013 12.8 12.0-16.0 g/dL Final   HCT  Date Value Ref Range Status  05/17/2015 32.1* 35.0 - 47.0 % Final  11/18/2013 33.5* 35.0-47.0 % Final    Physical Exam:  General: alert, cooperative and appears stated age 9: appropriate Uterine Fundus: firm DVT Evaluation: No evidence of DVT seen on physical exam.  Discharge Diagnoses: Term Pregnancy-delivered  Discharge Information: Date: 05/17/2015 Activity: unrestricted Diet: routine Medications: PNV Condition: stable  Rhogam injection before D/C  Contraception: Depo Provera Instructions: refer to practice specific booklet Discharge to: home Follow-up Information    Follow up with GUTIERREZ, COLLEEN, CNM. Schedule an appointment as soon as possible for a visit in 6 weeks.   Specialty:  Certified Nurse Midwife   Why:  postpartum follow up   Contact information:   [address] [place] [address] [phone]       Newborn Data: Live born female -Jiovannyi Birth Weight: 7 lb 12.2 oz (3520 g) APGAR: 8, 9  Rh positive   Home with mother.  Tresea Mall, CNM  This patient and plan were discussed with Dr Elesa Massed 05/17/2015

## 2015-05-16 NOTE — H&P (Signed)
HISTORY AND PHYSICAL  HISTORY OF PRESENT ILLNESS: [name] is a 25 y.o. [phone] at [redacted]w[redacted]d by a 10 week ultrasound with a pregnancy complicated by close interconceptual spacing, cholecystectomy at [redacted] weeks gestation, late entry to care, insufficient prenatal care, and marijuana use during pregnancy. She presents for induction of labor for advanced cervical dilation. Prenatal care began at 17 weeks at [facility], then she transferred to [facility] at 31 weeks with no care in between except a anatomy scan (posterior placenta) and a fetal echo which were both WNL.Received Rhogam at 31 weeks (O neg). Had  positive UDS for MJ (last positive was 1/25) during pregnancy. States she stopped smoking MJ 3 weeks ago. Wants to breast feed and use Depo for [facility]. Received TDAP 03/22/2015.  She has  been having irregular contractions  and denies leakage of fluid,  or decreased fetal movement. She had a blood tinged show 2 days ago.    REVIEW OF SYSTEMS: A complete review of systems was performed and was specifically negative for headache, changes in vision, RUQ pain, shortness of breath, chest pain, lower extremity edema and dysuria.   HISTORY:  Past Medical History  Diagnosis Date  . Gallstones   . GERD (gastroesophageal reflux disease)   . Anemia affecting pregnancy     Past Surgical History  Procedure Laterality Date  . Appendectomy    . Laparoscopic cholecystectomy  10/24/2014    [redacted] wks pregnant    Current meds: Diclegis and PNV   No Known Allergies  OB History  Gravida Para Term Preterm AB SAB TAB Ectopic Multiple Living  0 1 1 0 0 0 3    # Outcome Date GA Lbr Len/2nd Weight Sex Delivery Anes PTL Lv  5 Current           4 Term 11/17/13 [redacted]w[redacted]d  3.827 kg (8 lb 7 oz) M Vag-Spont   Y  3 Term 07/24/11 [redacted]w[redacted]d  3.685 kg (8 lb 2 oz) F Vag-Spont   Y  2 Term 07/16/09 [redacted]w[redacted]d  3.232 kg (7 lb 2 oz) F Vag-Spont   Y  1 SAB 2009 [redacted]w[redacted]d             Gynecologic History: History of Abnormal Pap Smear: no,  last Pap was ? History of STI: no  Social History  Substance Use Topics  . Smoking status: Never Smoker   . Smokeless tobacco: Never Used  . Alcohol Use: No   + MJ use almost daily during pregnancy until 3 weeks ago PHYSICAL EXAM: Pulse Rate:  [129] 129 (02/01 0739) Resp:  [22] 22 (02/01 0739) BP: (116)/(79) 116/79 mmHg (02/01 0739)  GENERAL: NAD AAOx3 CHEST:CTAB no increased work of breathing CV:RRR no appreciable murmurs ABDOMEN: gravid, nontender, EFW 7# by Le[place]olds EXTREMITIES:  Warm and well-perfused, nontender, nonedematous CERVIX: 4.5/ 60%/ -2/ vtx   FHT:145 baseline with moderate variability positive accelerations to 160 -170 and no decelerations  Toco: occasional contraction  DIAGNOSTIC STUDIES:  Recent Labs Lab 05/16/15 0824  WBC 7.0  HGB 11.7*  HCT 34.8*  PLT 147*    PRENATAL STUDIES:  Prenatal Labs:  MBT:O neg; Rubella immune, Varicella immune, HIV neg, RPR neg, Hep B neg, GC/CT neg, GBS neg, glucola 85     ASSESSMENT AND PLAN:  1. Fetal Well being  - Fetal Tracing:Cat 1 -Group B Streptococcus: negative - Presentation: vtx confirmed by vaginal exam  2. Routine OB: - Prenatal labs reviewed, as above - Rh negative-needs Rhogam if  baby RH positive  3. Induction of Labor:  -  Contractions occ with external toco in place -  Pelvis proven to 8# 7oz -  Plan for induction with Pitocin and then amniotomy -Stadol for pain or epidural if desires  4. Post Partum Planning: - Infant feeding: Breast - Contraception: Depo  Farrel Conners, CNM

## 2015-05-17 LAB — RPR: RPR Ser Ql: REACTIVE — AB

## 2015-05-17 LAB — URINE DRUG SCREEN, QUALITATIVE (ARMC ONLY)
AMPHETAMINES, UR SCREEN: NOT DETECTED
BARBITURATES, UR SCREEN: NOT DETECTED
BENZODIAZEPINE, UR SCRN: NOT DETECTED
Cannabinoid 50 Ng, Ur ~~LOC~~: NOT DETECTED
Cocaine Metabolite,Ur ~~LOC~~: NOT DETECTED
MDMA (Ecstasy)Ur Screen: NOT DETECTED
METHADONE SCREEN, URINE: NOT DETECTED
Opiate, Ur Screen: POSITIVE — AB
Phencyclidine (PCP) Ur S: NOT DETECTED
TRICYCLIC, UR SCREEN: NOT DETECTED

## 2015-05-17 LAB — CBC
HEMATOCRIT: 32.1 % — AB (ref 35.0–47.0)
HEMOGLOBIN: 10.7 g/dL — AB (ref 12.0–16.0)
MCH: 28.9 pg (ref 26.0–34.0)
MCHC: 33.5 g/dL (ref 32.0–36.0)
MCV: 86.3 fL (ref 80.0–100.0)
Platelets: 116 10*3/uL — ABNORMAL LOW (ref 150–440)
RBC: 3.72 MIL/uL — AB (ref 3.80–5.20)
RDW: 13.3 % (ref 11.5–14.5)
WBC: 9 10*3/uL (ref 3.6–11.0)

## 2015-05-17 LAB — RPR, QUANT+TP ABS (REFLEX): T Pallidum Abs: NEGATIVE

## 2015-05-17 LAB — FETAL SCREEN: FETAL SCREEN: NEGATIVE

## 2015-05-17 MED ORDER — RHO D IMMUNE GLOBULIN 1500 UNIT/2ML IJ SOSY
300.0000 ug | PREFILLED_SYRINGE | Freq: Once | INTRAMUSCULAR | Status: AC
  Administered 2015-05-17: 300 ug via INTRAMUSCULAR
  Filled 2015-05-17: qty 2

## 2015-05-17 MED ORDER — MEDROXYPROGESTERONE ACETATE 150 MG/ML IM SUSP
150.0000 mg | Freq: Once | INTRAMUSCULAR | Status: AC
  Administered 2015-05-17: 150 mg via INTRAMUSCULAR
  Filled 2015-05-17: qty 1

## 2015-05-17 MED ORDER — MEDROXYPROGESTERONE ACETATE 150 MG/ML IM SUSP: 150.0000 mg | Freq: Once | INTRAMUSCULAR | Status: DC

## 2015-05-17 NOTE — Anesthesia Post-op Follow-up Note (Signed)
  Anesthesia Pain Follow-up Note  Patient: Mary [name]  Day #: 1  Date of Follow-up: 05/17/2015 Time: 7:18 AM  Last Vitals:  Filed Vitals:   05/16/15 2252 05/17/15 0417  BP: 116/75 103/64  Pulse: 70 67  Temp: 36.8 C 36.7 C  Resp: 18 18    Level of Consciousness: alert  Pain: mild   Side Effects:None  Catheter Site Exam: site not evaluated  Plan: D/C from anesthesia care  Clydene Pugh

## 2015-05-17 NOTE — Progress Notes (Signed)
Rx given for med at home.  Discharged to home with newborn.

## 2015-05-17 NOTE — Anesthesia Postprocedure Evaluation (Signed)
Anesthesia Post Note  Patient: Mary [name]  Procedure(s) Performed: * No procedures listed *  Patient location during evaluation: Mother Baby Anesthesia Type: Epidural Level of consciousness: awake and alert Pain management: satisfactory to patient Vital Signs Assessment: post-procedure vital signs reviewed and stable Respiratory status: respiratory function stable Cardiovascular status: stable Postop Assessment: no headache and no backache Anesthetic complications: no    Last Vitals:  Filed Vitals:   05/16/15 2252 05/17/15 0417  BP: 116/75 103/64  Pulse: 70 67  Temp: 36.8 C 36.7 C  Resp: 18 18    Last Pain:  Filed Vitals:   05/17/15 0624  PainSc: Thomasene Ripple

## 2015-05-18 LAB — RHOGAM INJECTION: UNIT DIVISION: 0

## 2015-08-09 ENCOUNTER — Encounter: Payer: Self-pay | Admitting: Emergency Medicine

## 2015-08-09 ENCOUNTER — Emergency Department: Payer: Medicaid Other

## 2015-08-09 ENCOUNTER — Emergency Department
Admission: EM | Admit: 2015-08-09 | Discharge: 2015-08-09 | Disposition: A | Discharge status: Home / Self Care | Payer: Medicaid Other | Attending: Emergency Medicine | Admitting: Emergency Medicine

## 2015-08-09 DIAGNOSIS — R1012 Left upper quadrant pain: Secondary | ICD-10-CM | POA: Diagnosis present

## 2015-08-09 DIAGNOSIS — N12 Tubulo-interstitial nephritis, not specified as acute or chronic: Secondary | ICD-10-CM

## 2015-08-09 DIAGNOSIS — N10 Acute tubulo-interstitial nephritis: Secondary | ICD-10-CM | POA: Diagnosis not present

## 2015-08-09 DIAGNOSIS — F17200 Nicotine dependence, unspecified, uncomplicated: Secondary | ICD-10-CM | POA: Insufficient documentation

## 2015-08-09 LAB — URINALYSIS COMPLETE WITH MICROSCOPIC (ARMC ONLY)
Bilirubin Urine: NEGATIVE
Glucose, UA: NEGATIVE mg/dL
KETONES UR: NEGATIVE mg/dL
NITRITE: POSITIVE — AB
PROTEIN: 100 mg/dL — AB
Specific Gravity, Urine: 1.014 (ref 1.005–1.030)
pH: 6 (ref 5.0–8.0)

## 2015-08-09 LAB — COMPREHENSIVE METABOLIC PANEL
ALK PHOS: 85 U/L (ref 38–126)
ALT: 27 U/L (ref 14–54)
ANION GAP: 9 (ref 5–15)
AST: 33 U/L (ref 15–41)
Albumin: 4.3 g/dL (ref 3.5–5.0)
BILIRUBIN TOTAL: 0.9 mg/dL (ref 0.3–1.2)
BUN: 9 mg/dL (ref 6–20)
CALCIUM: 9.1 mg/dL (ref 8.9–10.3)
CO2: 22 mmol/L (ref 22–32)
Chloride: 106 mmol/L (ref 101–111)
Creatinine, Ser: 0.78 mg/dL (ref 0.44–1.00)
GFR calc non Af Amer: >60 mL/min (ref >60)
Glucose, Bld: 100 mg/dL — ABNORMAL HIGH (ref 65–99)
Potassium: 3.9 mmol/L (ref 3.5–5.1)
SODIUM: 137 mmol/L (ref 135–145)
TOTAL PROTEIN: 8.1 g/dL (ref 6.5–8.1)

## 2015-08-09 LAB — LACTIC ACID, PLASMA: LACTIC ACID, VENOUS: 1 mmol/L (ref 0.5–2.0)

## 2015-08-09 LAB — CBC
HEMATOCRIT: 38.9 % (ref 35.0–47.0)
Hemoglobin: 13.1 g/dL (ref 12.0–16.0)
MCH: 28.1 pg (ref 26.0–34.0)
MCHC: 33.6 g/dL (ref 32.0–36.0)
MCV: 83.7 fL (ref 80.0–100.0)
Platelets: 153 10*3/uL (ref 150–440)
RBC: 4.66 MIL/uL (ref 3.80–5.20)
RDW: 13.6 % (ref 11.5–14.5)
WBC: 12 10*3/uL — ABNORMAL HIGH (ref 3.6–11.0)

## 2015-08-09 LAB — LIPASE, BLOOD: Lipase: 13 U/L (ref 11–51)

## 2015-08-09 LAB — PREGNANCY, URINE: PREG TEST UR: NEGATIVE

## 2015-08-09 MED ORDER — ONDANSETRON HCL 4 MG/2ML IJ SOLN
4.0000 mg | Freq: Once | INTRAMUSCULAR | Status: AC
  Administered 2015-08-09: 4 mg via INTRAVENOUS
  Filled 2015-08-09: qty 2

## 2015-08-09 MED ORDER — MORPHINE SULFATE (PF) 4 MG/ML IV SOLN: 4.0000 mg | Freq: Once | INTRAVENOUS | Status: DC

## 2015-08-09 MED ORDER — ONDANSETRON 4 MG PO TBDP: 4.0000 mg | ORAL_TABLET | Freq: Four times a day (QID) | ORAL | Status: DC | PRN

## 2015-08-09 MED ORDER — DEXTROSE 5 % IV SOLN
2.0000 g | Freq: Once | INTRAVENOUS | Status: AC
  Administered 2015-08-09: 2 g via INTRAVENOUS
  Filled 2015-08-09: qty 2

## 2015-08-09 MED ORDER — SODIUM CHLORIDE 0.9 % IV BOLUS (SEPSIS)
1000.0000 mL | Freq: Once | INTRAVENOUS | Status: AC
  Administered 2015-08-09: 1000 mL via INTRAVENOUS

## 2015-08-09 MED ORDER — ACETAMINOPHEN 500 MG PO TABS
1000.0000 mg | ORAL_TABLET | ORAL | Status: AC
  Administered 2015-08-09: 1000 mg via ORAL
  Filled 2015-08-09: qty 2

## 2015-08-09 MED ORDER — HYDROCODONE-ACETAMINOPHEN 5-325 MG PO TABS: 1.0000 | ORAL_TABLET | Freq: Four times a day (QID) | ORAL | Status: DC | PRN

## 2015-08-09 MED ORDER — CEPHALEXIN 500 MG PO CAPS: 500.0000 mg | ORAL_CAPSULE | Freq: Four times a day (QID) | ORAL | Status: DC

## 2015-08-09 MED ORDER — MORPHINE SULFATE (PF) 2 MG/ML IV SOLN
INTRAVENOUS | Status: AC
  Administered 2015-08-09: 4 mg via INTRAVENOUS
  Filled 2015-08-09: qty 2

## 2015-08-09 NOTE — Discharge Instructions (Signed)
You have been seen in the Emergency Department (ED) today for a urinary tract infection that likely also involves infection around your kidney on the left (called pyelonephritis).    Please take your antibiotic as prescribed and over-the-counter pain medication (Tylenol or Motrin) as needed, but no more than recommended on the label instructions.  Drink PLENTY of fluids.  Call your regular doctor to schedule the next available appointment to follow up on todays ED visit, or return immediately to the ED if your pain worsens, you have decreased urine production, develop fever, persistent vomiting, or other symptoms that concern you.   Pyelonephritis, Adult Pyelonephritis is a kidney infection. The kidneys are the organs that filter a person's blood and move waste out of the bloodstream and into the urine. Urine passes from the kidneys, through the ureters, and into the bladder. There are two main types of pyelonephritis:  Infections that come on quickly without any warning (acute pyelonephritis).  Infections that last for a long period of time (chronic pyelonephritis). In most cases, the infection clears up with treatment and does not cause further problems. More severe infections or chronic infections can sometimes spread to the bloodstream or lead to other problems with the kidneys. CAUSES This condition is usually caused by:  Bacteria traveling from the bladder to the kidney through infected urine. The urine in the bladder can become infected with bacteria from:  Bladder infection (cystitis).  Inflammation of the prostate gland (prostatitis).  Sexual intercourse, in females.  Bacteria traveling from the bloodstream to the kidney. RISK FACTORS This condition is more likely to develop in:  Pregnant women.  Older people.  People who have diabetes.  People who have kidney stones or bladder stones.  People who have other abnormalities of the kidney or ureter.  People who have a  catheter placed in the bladder.  People who have cancer.  People who are sexually active.  Women who use spermicides.  People who have had a prior urinary tract infection. SYMPTOMS Symptoms of this condition include:  Frequent urination.  Strong or persistent urge to urinate.  Burning or stinging when urinating.  Abdominal pain.  Back pain.  Pain in the side or flank area.  Fever.  Chills.  Blood in the urine, or dark urine.  Nausea.  Vomiting. DIAGNOSIS This condition may be diagnosed based on:  Medical history and physical exam.  Urine tests.  Blood tests. You may also have imaging tests of the kidneys, such as an ultrasound or CT scan. TREATMENT Treatment for this condition may depend on the severity of the infection.  If the infection is mild and is found early, you may be treated with antibiotic medicines taken by mouth. You will need to drink fluids to remain hydrated.  If the infection is more severe, you may need to stay in the hospital and receive antibiotics given directly into a vein through an IV tube. You may also need to receive fluids through an IV tube if you are not able to remain hydrated. After your hospital stay, you may need to take oral antibiotics for a period of time. Other treatments may be required, depending on the cause of the infection. HOME CARE INSTRUCTIONS Medicines  Take over-the-counter and prescription medicines only as told by your health care provider.  If you were prescribed an antibiotic medicine, take it as told by your health care provider. Do not stop taking the antibiotic even if you start to feel better. General Instructions  Drink enough  fluid to keep your urine clear or pale yellow.  Avoid caffeine, tea, and carbonated beverages. They tend to irritate the bladder.  Urinate often. Avoid holding in urine for long periods of time.  Urinate before and after sex.  After a bowel movement, women should cleanse  from front to back. Use each tissue only once.  Keep all follow-up visits as told by your health care provider. This is important. SEEK MEDICAL CARE IF:  Your symptoms do not get better after 2 days of treatment.  Your symptoms get worse.  You have a fever. SEEK IMMEDIATE MEDICAL CARE IF:  You are unable to take your antibiotics or fluids.  You have shaking chills.  You vomit.  You have severe flank or back pain.  You have extreme weakness or fainting.   This information is not intended to replace advice given to you by your health care provider. Make sure you discuss any questions you have with your health care provider.   Document Released: 03/31/2005 Document Revised: 12/20/2014 Document Reviewed: 07/24/2014 Elsevier Interactive Patient Education [employer]2016 [employer] Inc.

## 2015-08-09 NOTE — ED Notes (Signed)
Pt with left upper abd pain and nausea  since Tuesday. HR 125, temp 99.9

## 2015-08-09 NOTE — ED Notes (Signed)
Discussed discharge instructions, prescriptions, and follow-up care with patient. No questions or concerns at this time. Pt stable at discharge.  

## 2015-08-09 NOTE — ED Provider Notes (Signed)
[facility] Emergency Department Provider Note  ____________________________________________  Time seen: Approximately 11:14 AM  I have reviewed the triage vital signs and the nursing notes.   HISTORY  Chief Complaint Abdominal Pain    HPI Mary [name] is a 25 y.o. female reports previous history of gallbladder and appendix removal.  About the last 2 days she's been having pain along her left upper abdomen and along the left side of the back. She also reports she is feeling like she's been having fevers, and her urine has seemed a little different and slightly darker than normal.  She denies any vomiting, does have some mild nausea at times. She still eating and drinking well. Denies pregnancy. No vaginal discharge or lower abdominal pain. No chest pain, shortness of breath or trouble breathing. No cough.  She and her husband are suspicious that she could have a urine infection.  Past Medical History  Diagnosis Date  . Gallstones   . GERD (gastroesophageal reflux disease)   . Anemia affecting pregnancy     Patient Active Problem List   Diagnosis Date Noted  . Postpartum care following vaginal delivery 05/17/2015  . Indication for care in labor or delivery 05/16/2015  . Irregular contractions 05/09/2015    Past Surgical History  Procedure Laterality Date  . Appendectomy    . Laparoscopic cholecystectomy  10/24/2014    [redacted] wks pregnant    Current Outpatient Rx  Name  Route  Sig  Dispense  Refill  . medroxyPROGESTERone (DEPO-PROVERA) 150 MG/ML injection   Intramuscular   Inject 1 mL (150 mg total) into the muscle once.   1 mL   0   . cephALEXin (KEFLEX) 500 MG capsule   Oral   Take 1 capsule (500 mg total) by mouth 4 (four) times daily.   40 capsule   0   . HYDROcodone-acetaminophen (NORCO/VICODIN) 5-325 MG tablet   Oral   Take 1 tablet by mouth every 6 (six) hours as needed for moderate pain.   15 tablet   0   .  ondansetron (ZOFRAN ODT) 4 MG disintegrating tablet   Oral   Take 1 tablet (4 mg total) by mouth every 6 (six) hours as needed for nausea or vomiting.   20 tablet   0     Allergies Review of patient's allergies indicates no known allergies.  History reviewed. No pertinent family history.  Social History Social History  Substance Use Topics  . Smoking status: Current Every Day Smoker  . Smokeless tobacco: Never Used  . Alcohol Use: No    Review of Systems Constitutional: Feels feverish at times Eyes: No visual changes. ENT: No sore throat. Cardiovascular: Denies chest pain. Respiratory: Denies shortness of breath. Gastrointestinal:No vomiting.  No diarrhea.  No constipation. Genitourinary: Urine seems abnormal, somewhat hard for her to describe which she reports is slightly darkened feels different. No pain on the right side. Musculoskeletal: Negative for back pain. Skin: Negative for rash. Neurological: Negative for headaches, focal weakness or numbness.  10-point ROS otherwise negative.  ____________________________________________   PHYSICAL EXAM:  VITAL SIGNS: ED Triage Vitals  Enc Vitals Group     BP 08/09/15 0948 109/65 mmHg     Pulse Rate 08/09/15 0948 125     Resp --      Temp 08/09/15 0948 99.9 F (37.7 C)     Temp Source 08/09/15 0948 Oral     SpO2 08/09/15 0948 98 %     Weight 08/09/15 0948  130 lb (58.968 kg)     Height 08/09/15 0948  (1.448 m)     Head Cir --      Peak Flow --      Pain Score 08/09/15 0959 9     Pain Loc --      Pain Edu? --      Excl. in GC? --    Constitutional: Alert and oriented. Well appearing and in no acute distress.Patient and husband both very pleasant. Eyes: Conjunctivae are normal. PERRL. EOMI. Head: Atraumatic. Nose: No congestion/rhinnorhea. Mouth/Throat: Mucous membranes are moist.  Oropharynx non-erythematous. Neck: No stridor.   Cardiovascular: Normal rate, regular rhythm. Grossly normal heart sounds.   Good peripheral circulation. Heart rate 90 and the time of my exam at approximately 11 AM. Respiratory: Normal respiratory effort.  No retractions. Lungs CTAB. Gastrointestinal: Soft and nontender set for some minimal discomfort in the left flank to palpation. No distention. No abdominal bruits. No right-sided CVA tenderness. Severe left sided CVA tenderness. Musculoskeletal: No lower extremity tenderness nor edema.   Neurologic:  Normal speech and language. No gross focal neurologic deficits are appreciated. Skin:  Skin is warm, dry and intact. No rash noted. Psychiatric: Mood and affect are normal. Speech and behavior are normal.  ____________________________________________   LABS (all labs ordered are listed, but only abnormal results are displayed)  Labs Reviewed  COMPREHENSIVE METABOLIC PANEL - Abnormal; Notable for the following:    Glucose, Bld 100 (*)    All other components within normal limits  CBC - Abnormal; Notable for the following:    WBC 12.0 (*)    All other components within normal limits  URINALYSIS COMPLETEWITH MICROSCOPIC ([facility] ONLY) - Abnormal; Notable for the following:    Color, Urine AMBER (*)    APPearance CLOUDY (*)    Hgb urine dipstick 1+ (*)    Protein, ur 100 (*)    Nitrite POSITIVE (*)    Leukocytes, UA 3+ (*)    Bacteria, UA MANY (*)    Squamous Epithelial / LPF 6-30 (*)    All other components within normal limits  CULTURE, BLOOD (ROUTINE X 2)  CULTURE, BLOOD (ROUTINE X 2)  URINE CULTURE  LIPASE, BLOOD  LACTIC ACID, PLASMA  PREGNANCY, URINE  LACTIC ACID, PLASMA   ____________________________________________  EKG  Reviewed and interpreted by me at 9:50 AM Sinus tachycardia with nonspecific T-wave abnormality PR 125 QRS 80 QTc 440 The patient denies any chest cardiac or pulmonary symptoms. Nonspecific T-wave abnormality is noted, but in the setting of really having no cardiac complaint, I do not find evidence to suggest clear ischemic  abnormality. ____________________________________________  RADIOLOGY  US Renal (Final result) Result time: 08/09/15 12:01:14   Final result by Rad Results In Interface (08/09/15 12:01:14)   Narrative:   CLINICAL DATA: Left-sided flank pain  EXAM: RENAL / URINARY TRACT ULTRASOUND COMPLETE  COMPARISON: None.  FINDINGS: Right Kidney:  Length: 11.9 cm. Echogenicity within normal limits. No mass or hydronephrosis visualized.  Left Kidney:  Length: 12.9 cm. Echogenicity within normal limits. No mass or hydronephrosis visualized.  Bladder:  Somewhat decompressed with prominence of the bladder wall likely related to the decompression.  IMPRESSION: No acute abnormality noted.   Electronically Signed By: Alcide Clever M.D. On: 08/09/2015 12:01    ____________________________________________   PROCEDURES  Procedure(s) performed: None  Critical Care performed: No  ____________________________________________   INITIAL IMPRESSION / ASSESSMENT AND PLAN / ED COURSE  Pertinent labs & imaging results that were available  during my care of the patient were reviewed by me and considered in my medical decision making (see chart for details).  Patient presents with reported fevers, left flank pain and urinary symptoms. Clinical history examination and lab results appear to be consistent with pyelonephritis. No signs or symptoms suggesting right-sided abdominal pain, she is status post cholecystectomy and appendectomy. No diarrhea or signs or symptoms such as severe abdominal pain, peritonitis, distention, or other findings to suggest a need for CT imaging. I suspect she has pyelonephritis, and she feels much better after fluids, nausea medication and pain control.  Discussed with the patient careful return precautions, treatment recommendations, and follow-up. She'll follow-up closely with the walk-in clinic or return to the ER 2 days for reexamination. If she is not  feeling improvement, or symptoms are worsening she will return to the ED.  Her husband is driving her home.  I will prescribe the patient a narcotic pain medicine due to their condition which I anticipate will cause at least moderate pain short term. I discussed with the patient safe use of narcotic pain medicines, and that they are not to drive, work in dangerous areas, or ever take more than prescribed (no more than 1 pill every 6 hours). We discussed that this is the type of medication that can be  overdosed on and the risks of this type of medicine. Patient is very agreeable to only use as prescribed and to never use more than prescribed. Patient is not breastfeeding or pregnant.  Return precautions and treatment recommendations and follow-up discussed with the patient who is agreeable with the plan.  ____________________________________________   FINAL CLINICAL IMPRESSION(S) / ED DIAGNOSES  Final diagnoses:  Pyelonephritis      Sharyn Creamer, MD 08/09/15 [phone]

## 2015-08-11 LAB — URINE CULTURE
Culture: >=100000 — AB
SPECIAL REQUESTS: NORMAL

## 2015-08-14 LAB — CULTURE, BLOOD (ROUTINE X 2)
Culture: NO GROWTH
Culture: NO GROWTH

## 2017-04-14 NOTE — L&D Delivery Note (Signed)
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 Mother pushed to deliver a viable female infant in LOA. CAN x1 reduced on perineum. BAby placed on mother's abdomen and dried. After a delayed cord clamping, the FOB cut the cord. Baby placed skin to skin with mother. Cord blood collected and sent to lab. No perineal, vaginal, or cervical lacerations seen. Anesthesia:    epidural Intrapartum complications: None GBS:    negative Laceration:    none Episiotomy:    none Placenta:    Via active 3rd stage. To pathology: no Estimated Blood Loss:  350 ml Baby:    Liveborn female, Apgars 8/9, weight 7#    Farrel Connersolleen Wednesday Ericsson, CNM

## 2017-05-06 ENCOUNTER — Other Ambulatory Visit: Payer: Self-pay

## 2017-05-06 ENCOUNTER — Emergency Department
Admission: EM | Admit: 2017-05-06 | Discharge: 2017-05-06 | Disposition: A | Discharge status: Home / Self Care | Payer: Medicaid Other | Attending: Emergency Medicine | Admitting: Emergency Medicine

## 2017-05-06 DIAGNOSIS — R1030 Lower abdominal pain, unspecified: Secondary | ICD-10-CM | POA: Insufficient documentation

## 2017-05-06 DIAGNOSIS — O9989 Other specified diseases and conditions complicating pregnancy, childbirth and the puerperium: Secondary | ICD-10-CM | POA: Diagnosis not present

## 2017-05-06 DIAGNOSIS — Z3A13 13 weeks gestation of pregnancy: Secondary | ICD-10-CM | POA: Diagnosis not present

## 2017-05-06 DIAGNOSIS — Z87891 Personal history of nicotine dependence: Secondary | ICD-10-CM | POA: Diagnosis not present

## 2017-05-06 DIAGNOSIS — R102 Pelvic and perineal pain: Secondary | ICD-10-CM | POA: Insufficient documentation

## 2017-05-06 DIAGNOSIS — R109 Unspecified abdominal pain: Secondary | ICD-10-CM

## 2017-05-06 DIAGNOSIS — O26899 Other specified pregnancy related conditions, unspecified trimester: Secondary | ICD-10-CM

## 2017-05-06 LAB — BASIC METABOLIC PANEL
ANION GAP: 9 (ref 5–15)
BUN: 8 mg/dL (ref 6–20)
CO2: 22 mmol/L (ref 22–32)
Calcium: 8.5 mg/dL — ABNORMAL LOW (ref 8.9–10.3)
Chloride: 105 mmol/L (ref 101–111)
Creatinine, Ser: 0.56 mg/dL (ref 0.44–1.00)
GFR calc Af Amer: >60 mL/min (ref >60)
Glucose, Bld: 91 mg/dL (ref 65–99)
POTASSIUM: 3.3 mmol/L — AB (ref 3.5–5.1)
SODIUM: 136 mmol/L (ref 135–145)

## 2017-05-06 LAB — URINALYSIS, COMPLETE (UACMP) WITH MICROSCOPIC
Bilirubin Urine: NEGATIVE
Glucose, UA: NEGATIVE mg/dL
Hgb urine dipstick: NEGATIVE
KETONES UR: 20 mg/dL — AB
Nitrite: NEGATIVE
Protein, ur: NEGATIVE mg/dL
Specific Gravity, Urine: 1.023 (ref 1.005–1.030)
pH: 6 (ref 5.0–8.0)

## 2017-05-06 LAB — CBC
HCT: 35.7 % (ref 35.0–47.0)
Hemoglobin: 12.4 g/dL (ref 12.0–16.0)
MCH: 30.3 pg (ref 26.0–34.0)
MCHC: 34.7 g/dL (ref 32.0–36.0)
MCV: 87.5 fL (ref 80.0–100.0)
Platelets: 205 10*3/uL (ref 150–440)
RBC: 4.08 MIL/uL (ref 3.80–5.20)
RDW: 12.8 % (ref 11.5–14.5)
WBC: 9 10*3/uL (ref 3.6–11.0)

## 2017-05-06 LAB — POCT PREGNANCY, URINE: PREG TEST UR: POSITIVE — AB

## 2017-05-06 LAB — HCG, QUANTITATIVE, PREGNANCY: hCG, Beta Chain, Quant, S: 25967 m[IU]/mL — ABNORMAL HIGH (ref <5)

## 2017-05-06 NOTE — ED Notes (Signed)

## 2017-05-06 NOTE — ED Triage Notes (Signed)
Pt is [redacted] weeks pregnant and c/o lower abd pain with slight vaginal bleeding (one month ago but resolved at this time) - pt states she had previous miscarriage and is concerned that this pain is the same

## 2017-05-06 NOTE — Discharge Instructions (Signed)
Please seek medical attention for any high fevers, chest pain, shortness of breath, change in behavior, persistent vomiting, bloody stool or any other new or concerning symptoms.  

## 2017-05-06 NOTE — ED Notes (Addendum)
Pt states poor appetite, limited PO fluids, reporting dizziness, pt states does not want to take antiemetics for fear will harm the baby. Pt states this is 6th pregnancy, 1 miscarriage, 4 births-no complications, and pregnant currently.   Pt states bleeding 1 month ago for approx 1 hour, with clots.   Pt has not seen OB yet with this pregnancy.

## 2017-05-06 NOTE — ED Provider Notes (Signed)
[facility] Emergency Department Provider Note   ____________________________________________   I have reviewed the triage vital signs and the nursing notes.   HISTORY  Chief Complaint Abdominal Pain   History limited by: Not Limited   HPI Mary [name] is a 27 y.o. female who presents to the emergency department today because of concerns for abdominal pain in the setting of pregnancy.  Patient thinks that she is about 3 months pregnant based on when she took a pregnancy test.  She states that she did have an episode of bleeding about a month ago but did not seek care at that time.  Since that time she has had some lower abdominal pain however today it was more severe.  She states that the pain she felt today reminds her of the pain she had when she had had a miscarriage with the previous pregnancy.  She however denies any bleeding today.  She denies any other abnormal vaginal discharge.  She does states she has had some associated weakness and dizziness with this pregnancy.  She has been taking prenatal vitamins.  Per medical record review patient has a history of anemia affecting pregnancy.  Past Medical History:  Diagnosis Date  . Anemia affecting pregnancy   . Gallstones   . GERD (gastroesophageal reflux disease)     Patient Active Problem List   Diagnosis Date Noted  . Postpartum care following vaginal delivery 05/17/2015  . Indication for care in labor or delivery 05/16/2015  . Irregular contractions 05/09/2015    Past Surgical History:  Procedure Laterality Date  . APPENDECTOMY    . LAPAROSCOPIC CHOLECYSTECTOMY  10/24/2014   [redacted] wks pregnant    Prior to Admission medications   Medication Sig Start Date End Date Taking? Authorizing Provider  cephALEXin (KEFLEX) 500 MG capsule Take 1 capsule (500 mg total) by mouth 4 (four) times daily. 08/09/15   Sharyn CreamerQuale, Mark, MD  HYDROcodone-acetaminophen (NORCO/VICODIN) 5-325 MG tablet Take 1 tablet  by mouth every 6 (six) hours as needed for moderate pain. 08/09/15   Sharyn CreamerQuale, Mark, MD  medroxyPROGESTERone (DEPO-PROVERA) 150 MG/ML injection Inject 1 mL (150 mg total) into the muscle once. 05/17/15   Tresea MallGledhill, Jane, CNM  ondansetron (ZOFRAN ODT) 4 MG disintegrating tablet Take 1 tablet (4 mg total) by mouth every 6 (six) hours as needed for nausea or vomiting. 08/09/15   Sharyn CreamerQuale, Mark, MD    Allergies Patient has no known allergies.  No family history on file.  Social History Social History   Tobacco Use  . Smoking status: Former Smoker    Types: Cigarettes    Last attempt to quit: 04/05/2017    Years since quitting: 0.0  . Smokeless tobacco: Never Used  Substance Use Topics  . Alcohol use: No  . Drug use: No    Review of Systems Constitutional: No fever/chills. Positive for weakness.  Eyes: No visual changes. ENT: No sore throat. Cardiovascular: Denies chest pain. Respiratory: Denies shortness of breath. Gastrointestinal: Positive for lower abdominal pain. Genitourinary: Negative for dysuria. Musculoskeletal: Negative for back pain. Skin: Negative for rash. Neurological: Negative for headaches, focal weakness or numbness.  ____________________________________________   PHYSICAL EXAM:  VITAL SIGNS: ED Triage Vitals  Enc Vitals Group     BP 05/06/17 1839 104/60     Pulse Rate 05/06/17 1839 88     Resp 05/06/17 1839 15     Temp 05/06/17 1839 98.4 F (36.9 C)     Temp Source 05/06/17 1839 Oral  SpO2 05/06/17 1839 100 %     Weight 05/06/17 1837 120 lb (54.4 kg)     Height 05/06/17 1837 4\' 9"  (1.448 m)     Head Circumference --      Peak Flow --      Pain Score 05/06/17 1836 6    Constitutional: Alert and oriented. Well appearing and in no distress. Eyes: Conjunctivae are normal.  ENT   Head: Normocephalic and atraumatic.   Nose: No congestion/rhinnorhea.   Mouth/Throat: Mucous membranes are moist.   Neck: No  stridor. Hematological/Lymphatic/Immunilogical: No cervical lymphadenopathy. Cardiovascular: Normal rate, regular rhythm.  No murmurs, rubs, or gallops.  Respiratory: Normal respiratory effort without tachypnea nor retractions. Breath sounds are clear and equal bilaterally. No wheezes/rales/rhonchi. Gastrointestinal: Soft and non tender. No rebound. No guarding.  Genitourinary: Deferred Musculoskeletal: Normal range of motion in all extremities. No lower extremity edema. Neurologic:  Normal speech and language. No gross focal neurologic deficits are appreciated.  Skin:  Skin is warm, dry and intact. No rash noted. Psychiatric: Mood and affect are normal. Speech and behavior are normal. Patient exhibits appropriate insight and judgment.  ____________________________________________    LABS (pertinent positives/negatives)  Upreg positive CBC wnl BMP k 3.3, ca 8.5 UA large ketones, 0-5 wbc, 6-30 squamous  ____________________________________________   EKG  None  ____________________________________________    RADIOLOGY  Bedside US shows single live IUP  ____________________________________________   PROCEDURES  Procedures  ____________________________________________   INITIAL IMPRESSION / ASSESSMENT AND PLAN / ED COURSE  Pertinent labs & imaging results that were available during my care of the patient were reviewed by me and considered in my medical decision making (see chart for details).  Patient presented to the emergency department today because of concerns for abdominal pain in the setting of pregnancy.  Patient has not had any recent bleeding.  Although she had bleeding a month ago she is well outside the window for RhoGam if necessary. Bedside US does show a live IUP. Patient was complaining of weakness. Labwork without concerning anemia. Mild hypokalemia. Discussed results with patient discussed importance of ob/gyn follow  up.   ____________________________________________   FINAL CLINICAL IMPRESSION(S) / ED DIAGNOSES  Final diagnoses:  Abdominal pain during pregnancy, antepartum     Note: This dictation was prepared with Dragon dictation. Any transcriptional errors that result from this process are unintentional     Phineas Semen, MD 05/06/17 2240

## 2017-05-14 ENCOUNTER — Encounter: Payer: Self-pay | Admitting: Obstetrics and Gynecology

## 2017-05-14 ENCOUNTER — Ambulatory Visit (INDEPENDENT_AMBULATORY_CARE_PROVIDER_SITE_OTHER): Payer: Medicaid Other | Admitting: Obstetrics and Gynecology

## 2017-05-14 VITALS — BP 100/60 | Wt 130.0 lb

## 2017-05-14 DIAGNOSIS — Z124 Encounter for screening for malignant neoplasm of cervix: Secondary | ICD-10-CM

## 2017-05-14 DIAGNOSIS — O09899 Supervision of other high risk pregnancies, unspecified trimester: Secondary | ICD-10-CM

## 2017-05-14 DIAGNOSIS — Z3480 Encounter for supervision of other normal pregnancy, unspecified trimester: Secondary | ICD-10-CM | POA: Insufficient documentation

## 2017-05-14 DIAGNOSIS — O26899 Other specified pregnancy related conditions, unspecified trimester: Secondary | ICD-10-CM

## 2017-05-14 DIAGNOSIS — Z6791 Unspecified blood type, Rh negative: Secondary | ICD-10-CM | POA: Insufficient documentation

## 2017-05-14 DIAGNOSIS — Z1379 Encounter for other screening for genetic and chromosomal anomalies: Secondary | ICD-10-CM

## 2017-05-14 MED ORDER — PROVIDA OB 20-20-1.25 MG PO CAPS: 1.0000 | ORAL_CAPSULE | Freq: Every day | ORAL | 11 refills | Status: AC

## 2017-05-14 NOTE — Progress Notes (Signed)
05/14/2017   Chief Complaint: Missed period  Transfer of Care Patient: no  History of Present Illness: [name] is a 27 y.o. [id number] [redacted]w[redacted]d based on Patient's last menstrual period was 02/06/2017. with an Estimated Date of Delivery: 11/13/17, with the above CC.   Her periods were: regular periods every 28 days She was using no method when she conceived.  She has Positive signs or symptoms of nausea/vomiting of pregnancy. She has Negative signs or symptoms of miscarriage or preterm labor She identifies Negative Zika risk factors for her and her partner On any different medications around the time she conceived/early pregnancy: No  History of varicella: Yes   ROS: A 12-point review of systems was performed and negative, except as stated in the above HPI.  OBGYN History: As per HPI. OB History  Gravida Para Term Preterm AB Living  6 4 4  0 1 4  SAB TAB Ectopic Multiple Live Births  1 0 0 0 4    # Outcome Date GA Lbr Len/2nd Weight Sex Delivery Anes PTL Lv  6 Current           5 Term 05/16/15 [redacted]w[redacted]d 03:15 / 00:27 7 lb 12.2 oz (3.52 kg) M Vag-Spont EPI  LIV  4 Term 11/17/13 [redacted]w[redacted]d  8 lb 7 oz (3.827 kg) M Vag-Spont   LIV  3 Term 07/24/11 [redacted]w[redacted]d  8 lb 2 oz (3.685 kg) F Vag-Spont   LIV  2 Term 07/16/09 [redacted]w[redacted]d  7 lb 2 oz (3.232 kg) F Vag-Spont   LIV  1 SAB 2009 [redacted]w[redacted]d             Any issues with any prior pregnancies: no Any prior children are healthy, doing well, without any problems or issues: yes History of pap smears: Yes. Last pap smear unknown History of STIs: No   Past Medical History: Past Medical History:  Diagnosis Date  . Anemia affecting pregnancy   . Gallstones   . GERD (gastroesophageal reflux disease)     Past Surgical History: Past Surgical History:  Procedure Laterality Date  . APPENDECTOMY    . LAPAROSCOPIC CHOLECYSTECTOMY  10/24/2014   [redacted] wks pregnant    Family History:  Family History  Problem Relation Age of Onset  . Hypertension Mother   .  Diabetes Mother   . Lupus Mother   . Hypertension Father    She denies any female cancers, bleeding or blood clotting disorders.  She reports that her brother has a heart defect : "hole in his heart." Denies any other history of mental retardation, birth defects or genetic disorders in her or the FOB's history.  Social History:  Social History   Socioeconomic History  . Marital status: Married    Spouse name: Shellia Carwin  . Number of children: 3  . Years of education: 8  . Highest education level: Not on file  Social Needs  . Financial resource strain: Not on file  . Food insecurity - worry: Not on file  . Food insecurity - inability: Not on file  . Transportation needs - medical: Not on file  . Transportation needs - non-medical: Not on file  Occupational History  . Occupation: House wife  Tobacco Use  . Smoking status: Former Smoker    Types: Cigarettes    Last attempt to quit: 04/05/2017    Years since quitting: 0.1  . Smokeless tobacco: Never Used  Substance and Sexual Activity  . Alcohol use: No  . Drug use: Yes  Types: Marijuana    Comment: Reports that she uses marijuana ony for nausea in pregnancy  . Sexual activity: Yes    Birth control/protection: None  Other Topics Concern  . Not on file  Social History Narrative  . Not on file   Reports that she smokes marijuana only during pregnancy for nausea. Says she did this with her previous child and he was totally fine.   Any pets in the household: no No cats  Allergy: No Known Allergies  Current Outpatient Medications:  Current Outpatient Medications:  .  cephALEXin (KEFLEX) 500 MG capsule, Take 1 capsule (500 mg total) by mouth 4 (four) times daily. (Patient not taking: Reported on 05/14/2017), Disp: 40 capsule, Rfl: 0 .  HYDROcodone-acetaminophen (NORCO/VICODIN) 5-325 MG tablet, Take 1 tablet by mouth every 6 (six) hours as needed for moderate pain. (Patient not taking: Reported on 05/14/2017), Disp: 15  tablet, Rfl: 0 .  medroxyPROGESTERone (DEPO-PROVERA) 150 MG/ML injection, Inject 1 mL (150 mg total) into the muscle once. (Patient not taking: Reported on 05/14/2017), Disp: 1 mL, Rfl: 0 .  ondansetron (ZOFRAN ODT) 4 MG disintegrating tablet, Take 1 tablet (4 mg total) by mouth every 6 (six) hours as needed for nausea or vomiting. (Patient not taking: Reported on 05/14/2017), Disp: 20 tablet, Rfl: 0 .  Prenat w/o A Vit-FeFum-FePo-FA (PROVIDA OB) 20-20-1.25 MG CAPS, Take 1 tablet by mouth daily., Disp: 30 capsule, Rfl: 11   Physical Exam:   BP 100/60   Wt 130 lb (59 kg)   LMP 02/06/2017   BMI 28.13 kg/m  Body mass index is 28.13 kg/m. Constitutional: Well nourished, well developed female in no acute distress.  Neck:  Supple, normal appearance, and no thyromegaly  Cardiovascular: S1, S2 normal, no murmur, rub or gallop, regular rate and rhythm Respiratory:  Clear to auscultation bilateral. Normal respiratory effort Abdomen: positive bowel sounds and no masses, hernias; diffusely non tender to palpation, non distended Breasts: breasts appear normal, no suspicious masses, no skin or nipple changes or axillary nodes. Neuro/Psych:  Normal mood and affect.  Skin:  Warm and dry.  Lymphatic:  No inguinal lymphadenopathy.   Pelvic exam: is not limited by body habitus EGBUS: within normal limits, Vagina: within normal limits and with no blood in the vault, Cervix: normal appearing cervix without discharge or lesions, closed/long/high, Uterus:  enlarged: 18cm, and Adnexa:  normal adnexa and no mass, fullness, tenderness  Assessment: [name] is a 27 y.o. [id number] 10756w0d based on Patient's last menstrual period was 02/06/2017. Bedside US showed a Femur length of 18 0/7 today. with an Estimated Date of Delivery: 10/15/17,  for prenatal care.  Plan:  1) Avoid alcoholic beverages. 2) Patient encouraged not to smoke. Discussed risk of fetal marijuana exposure, encouraged cessation.  3)  Discontinue the use of all non-medicinal drugs and chemicals.  4) Take prenatal vitamins daily.  5) Seatbelt use advised 6) Nutrition, food safety (fish, cheese advisories, and high nitrite foods) and exercise discussed. 7) [facility] and [facility]  8) Patient is asked about travel to areas at risk for the Zika virus, and counseled to avoid travel and exposure to mosquitoes or sexual partners who may have themselves been exposed to the virus. Testing is discussed, and will be ordered as appropriate.  9) [facility] advised 10) Genetic Screening, such as with 1st Trimester Screening, cell free fetal DNA, AFP testing, and Ultrasound, as well as with amniocentesis and CVS as appropriate, is  discussed with patient. She plans to have genetic testing this pregnancy. NIPT ordered today.  11) Pap smear today 12) Family history of heart defect, will obtain fetal echo.  13) Desires tubal ligation postpartum  Problem list reviewed and updated.  Adelene Idler MD [facility], [facility] 05/14/2017  5:54 PM

## 2017-05-15 LAB — RPR+RH+ABO+RUB AB+AB SCR+CB...
ANTIBODY SCREEN: NEGATIVE
HIV SCREEN 4TH GENERATION: NONREACTIVE
Hematocrit: 36.2 % (ref 34.0–46.6)
Hemoglobin: 12.4 g/dL (ref 11.1–15.9)
Hepatitis B Surface Ag: NEGATIVE
MCH: 30.3 pg (ref 26.6–33.0)
MCHC: 34.3 g/dL (ref 31.5–35.7)
MCV: 89 fL (ref 79–97)
Platelets: 236 10*3/uL (ref 150–379)
RBC: 4.09 x10E6/uL (ref 3.77–5.28)
RDW: 13.1 % (ref 12.3–15.4)
RPR: NONREACTIVE
Rh Factor: NEGATIVE
Rubella Antibodies, IGG: 1.89 index (ref >0.99)
VARICELLA: 289 {index} (ref >165)
WBC: 10 10*3/uL (ref 3.4–10.8)

## 2017-05-18 NOTE — Progress Notes (Signed)
Tried to call, but went directly to voicemail. Will you let this patient know that her results are normal.  Thank you!

## 2017-05-18 NOTE — Progress Notes (Signed)
Treied to call but went directly to voicemail and could not leave message. Results normal.

## 2017-05-19 LAB — MATERNIT 21 PLUS CORE, BLOOD
CHROMOSOME 21: NEGATIVE
Chromosome 13: NEGATIVE
Chromosome 18: NEGATIVE
Y Chromosome: DETECTED

## 2017-05-19 NOTE — Progress Notes (Signed)
Called and discussed result with patient. Normal xy female

## 2017-05-20 LAB — PAP LB, CT-NG, RFX HPV ASCU
CHLAMYDIA, NUC. ACID AMP: NEGATIVE
GONOCOCCUS, NUC. ACID AMP: NEGATIVE
PAP Smear Comment: 0

## 2017-05-21 LAB — URINE DRUG PANEL 7
Amphetamines, Urine: NEGATIVE ng/mL
Barbiturate Quant, Ur: NEGATIVE ng/mL
Benzodiazepine Quant, Ur: NEGATIVE ng/mL
COCAINE (METAB.): NEGATIVE ng/mL
Cannabinoid Quant, Ur: POSITIVE — AB
OPIATE QUANT UR: NEGATIVE ng/mL
PCP Quant, Ur: NEGATIVE ng/mL

## 2017-05-21 LAB — URINE CULTURE

## 2017-05-22 ENCOUNTER — Ambulatory Visit (INDEPENDENT_AMBULATORY_CARE_PROVIDER_SITE_OTHER): Payer: Medicaid Other

## 2017-05-22 ENCOUNTER — Ambulatory Visit (INDEPENDENT_AMBULATORY_CARE_PROVIDER_SITE_OTHER): Payer: Medicaid Other | Admitting: Obstetrics and Gynecology

## 2017-05-22 DIAGNOSIS — Z3480 Encounter for supervision of other normal pregnancy, unspecified trimester: Secondary | ICD-10-CM | POA: Diagnosis not present

## 2017-05-22 NOTE — Progress Notes (Signed)
Routine Prenatal Care Visit  Subjective  Mary [name] is a 27 y.o. [phone] at 671w6d being seen today for ongoing prenatal care.  She is currently monitored for the following issues for this high-risk pregnancy and has Irregular contractions; Indication for care in labor or delivery; Postpartum care following vaginal delivery; Supervision of other normal pregnancy, antepartum; and Rh negative state in antepartum period on their problem list.  ----------------------------------------------------------------------------------- Patient reports no complaints.   Contractions: Not present. Vag. Bleeding: None.  Movement: Present. Denies leaking of fluid.  ----------------------------------------------------------------------------------- The following portions of the patient's history were reviewed and updated as appropriate: allergies, current medications, past family history, past medical history, past social history, past surgical history and problem list. Problem list updated.   Objective  Blood pressure 100/60, weight 126 lb (57.2 kg), last menstrual period 02/06/2017, unknown if currently breastfeeding. Pregravid weight 119 lb (54 kg) Total Weight Gain 7 lb (3.175 kg) Urinalysis: Urine Protein: Negative Urine Glucose: Negative  Fetal Status: Fetal Heart Rate (bpm): 135   Movement: Present     General:  Alert, oriented and cooperative. Patient is in no acute distress.  Skin: Skin is warm and dry. No rash noted.   Cardiovascular: Normal heart rate noted  Respiratory: Normal respiratory effort, no problems with respiration noted  Abdomen: Soft, gravid, appropriate for gestational age. Pain/Pressure: Absent     Pelvic:  Cervical exam deferred        Extremities: Normal range of motion.     ental Status: Normal mood and affect. Normal behavior. Normal judgment and thought content.     Assessment   27 y.o. [id number] at 491w6d by  10/17/2017, by Ultrasound presenting for routine  prenatal visit  Plan    pregnancy6 Problems (from 02/05/17 to present)    Problem Noted Resolved   Supervision of other normal pregnancy, antepartum 05/14/2017 by Natale MilchSchuman, Christanna R, MD No   Overview Addendum 05/22/2017  2:35 PM by Natale MilchSchuman, Christanna R, MD      Clinic [facility] Prenatal Labs  Dating  Bedside US Blood type: O/Negative/-- (01/31 1634)   Genetic Screen NIPT: normal XY Antibody:Negative (01/31 1634)  Anatomic US  Rubella: 1.89 (01/31 1634) Varicella: Immune  GTT Early:        28 wk:      RPR: Non Reactive (01/31 1634)   Rhogam  HBsAg: Negative (01/31 1634)   TDaP vaccine                       HIV: Non Reactive (01/31 1634)   Flu Shot                                GBS:   Contraception  Tubal  Pap: NIL  CBB     CS/VBAC    Baby Food  Breast   Support Person             Rh negative state in antepartum period 05/14/2017 by Natale MilchSchuman, Christanna R, MD No   Overview Signed 05/14/2017  5:53 PM by Natale MilchSchuman, Christanna R, MD    [ ]  rhogam at 28wk appointment          Gestational age appropriate obstetric precautions including but not limited to vaginal bleeding, contractions, leaking of fluid and fetal movement were reviewed in detail with the patient.    Dating Scan today, EDD updated. Patient desires tubal ligation, will need to sign  consent form Return in about 2 weeks (around 06/05/2017) for anatomy US and [name].  Adelene Idler MD [facility], [facility] 05/22/2017, 3:54 PM

## 2017-06-05 ENCOUNTER — Encounter: Payer: Medicaid Other | Admitting: Advanced Practice Midwife

## 2017-06-05 ENCOUNTER — Other Ambulatory Visit: Payer: Medicaid Other

## 2017-06-10 ENCOUNTER — Encounter: Payer: Self-pay | Admitting: Advanced Practice Midwife

## 2017-06-10 ENCOUNTER — Ambulatory Visit (INDEPENDENT_AMBULATORY_CARE_PROVIDER_SITE_OTHER): Payer: Medicaid Other

## 2017-06-10 ENCOUNTER — Ambulatory Visit (INDEPENDENT_AMBULATORY_CARE_PROVIDER_SITE_OTHER): Payer: Medicaid Other | Admitting: Advanced Practice Midwife

## 2017-06-10 VITALS — BP 112/52 | Wt 127.0 lb

## 2017-06-10 DIAGNOSIS — Z23 Encounter for immunization: Secondary | ICD-10-CM | POA: Diagnosis not present

## 2017-06-10 DIAGNOSIS — Z3480 Encounter for supervision of other normal pregnancy, unspecified trimester: Secondary | ICD-10-CM | POA: Diagnosis not present

## 2017-06-10 DIAGNOSIS — Z3A21 21 weeks gestation of pregnancy: Secondary | ICD-10-CM

## 2017-06-10 NOTE — Progress Notes (Signed)
[name] Follow up anatomy scan today Flu vaccine given

## 2017-06-10 NOTE — Progress Notes (Signed)
Routine Prenatal Care Visit  Subjective  Mary [name] is a 27 y.o. [phone] at 3884w4d being seen today for ongoing prenatal care.  She is currently monitored for the following issues for this low-risk pregnancy and has Supervision of other normal pregnancy, antepartum and Rh negative state in antepartum period on their problem list.  ----------------------------------------------------------------------------------- Patient reports no complaints.   Contractions: Not present. Vag. Bleeding: None.  Movement: Absent. Denies leaking of fluid.  ----------------------------------------------------------------------------------- The following portions of the patient's history were reviewed and updated as appropriate: allergies, current medications, past family history, past medical history, past social history, past surgical history and problem list. Problem list updated.   Objective  Blood pressure (!) 112/52, weight 127 lb (57.6 kg), last menstrual period 02/06/2017 Pregravid weight 119 lb (54 kg) Total Weight Gain 8 lb (3.629 kg) Urinalysis: Urine Protein: Negative Urine Glucose: Negative  Fetal Status: Fetal Heart Rate (bpm): 152 Fundal Height: 21 cm Movement: Absent     Follow up anatomy scan today is now complete  General:  Alert, oriented and cooperative. Patient is in no acute distress.  Skin: Skin is warm and dry. No rash noted.   Cardiovascular: Normal heart rate noted  Respiratory: Normal respiratory effort, no problems with respiration noted  Abdomen: Soft, gravid, appropriate for gestational age. Pain/Pressure: Absent     Pelvic:  Cervical exam deferred        Extremities: Normal range of motion.  Edema: None  Mental Status: Normal mood and affect. Normal behavior. Normal judgment and thought content.   Assessment   27 y.o. [id number] at 7584w4d by  10/17/2017, by Ultrasound presenting for routine prenatal visit  Plan   pregnancy6 Problems (from 02/05/17 to present)    Problem Noted Resolved   Supervision of other normal pregnancy, antepartum 05/14/2017 by Natale MilchSchuman, Christanna R, MD No   Overview Addendum 05/22/2017  3:53 PM by Natale MilchSchuman, Christanna R, MD      Clinic [facility] Prenatal Labs  Dating  Bedside US Blood type: O/Negative/-- (01/31 1634)   Genetic Screen NIPT: normal XY Antibody:Negative (01/31 1634)  Anatomic US  Rubella: 1.89 (01/31 1634) Varicella: Immune  GTT Early:        28 wk:      RPR: Non Reactive (01/31 1634)   Rhogam  HBsAg: Negative (01/31 1634)   TDaP vaccine                       HIV: Non Reactive (01/31 1634)   Flu Shot                                GBS:   Contraception  Tubal [ ] sign consent Pap: NIL  CBB     CS/VBAC    Baby Food  Breast   Support Person             Rh negative state in antepartum period 05/14/2017 by Natale MilchSchuman, Christanna R, MD No   Overview Signed 05/14/2017  5:53 PM by Natale MilchSchuman, Christanna R, MD    [ ]  rhogam at 28wk appointment          Preterm labor symptoms and general obstetric precautions including but not limited to vaginal bleeding, contractions, leaking of fluid and fetal movement were reviewed in detail with the patient. Please refer to After Visit Summary for other counseling recommendations.   Return in about 4 weeks (around 07/08/2017) for rob.  Tresea MallJane Aidon Klemens,  CNM 06/10/2017 2:17 PM

## 2017-06-10 NOTE — Patient Instructions (Signed)

## 2017-07-09 ENCOUNTER — Ambulatory Visit (INDEPENDENT_AMBULATORY_CARE_PROVIDER_SITE_OTHER): Payer: Medicaid Other | Admitting: Obstetrics and Gynecology

## 2017-07-09 ENCOUNTER — Encounter: Payer: Self-pay | Admitting: Obstetrics and Gynecology

## 2017-07-09 VITALS — BP 110/50 | Wt 138.0 lb

## 2017-07-09 DIAGNOSIS — Z87898 Personal history of other specified conditions: Secondary | ICD-10-CM

## 2017-07-09 DIAGNOSIS — O26899 Other specified pregnancy related conditions, unspecified trimester: Secondary | ICD-10-CM

## 2017-07-09 DIAGNOSIS — Z3480 Encounter for supervision of other normal pregnancy, unspecified trimester: Secondary | ICD-10-CM

## 2017-07-09 DIAGNOSIS — Z6791 Unspecified blood type, Rh negative: Secondary | ICD-10-CM

## 2017-07-09 DIAGNOSIS — Z3A25 25 weeks gestation of pregnancy: Secondary | ICD-10-CM

## 2017-07-09 DIAGNOSIS — O09899 Supervision of other high risk pregnancies, unspecified trimester: Secondary | ICD-10-CM

## 2017-07-09 DIAGNOSIS — Z8279 Family history of other congenital malformations, deformations and chromosomal abnormalities: Secondary | ICD-10-CM

## 2017-07-09 NOTE — Progress Notes (Signed)
[name] No vb. No lof

## 2017-07-09 NOTE — Progress Notes (Signed)
Routine Prenatal Care Visit  Subjective  Mary [name] is a 27 y.o. [phone] at [redacted]w[redacted]d being seen today for ongoing prenatal care.  She is currently monitored for the following issues for this low-risk pregnancy and has Supervision of other normal pregnancy, antepartum and Rh negative state in antepartum period on their problem list.  ----------------------------------------------------------------------------------- Patient reports excessive sweating of palms, armpits, and feet.   Contractions: Not present. Vag. Bleeding: None.  Movement: Present. Denies leaking of fluid.  ----------------------------------------------------------------------------------- The following portions of the patient's history were reviewed and updated as appropriate: allergies, current medications, past family history, past medical history, past social history, past surgical history and problem list. Problem list updated.   Objective  Blood pressure (!) 110/50, weight 138 lb (62.6 kg), last menstrual period 02/06/2017, unknown if currently breastfeeding. Pregravid weight 119 lb (54 kg) Total Weight Gain 19 lb (8.618 kg) Urinalysis:      Fetal Status: Fetal Heart Rate (bpm): 150 Fundal Height: 24 cm Movement: Present     General:  Alert, oriented and cooperative. Patient is in no acute distress.  Skin: Skin is warm and dry. No rash noted.   Cardiovascular: Normal heart rate noted  Respiratory: Normal respiratory effort, no problems with respiration noted  Abdomen: Soft, gravid, appropriate for gestational age. Pain/Pressure: Absent     Pelvic:  Cervical exam deferred        Extremities: Normal range of motion.     ental Status: Normal mood and affect. Normal behavior. Normal judgment and thought content.     Assessment   27 y.o. [id number] at [redacted]w[redacted]d by  10/17/2017, by Ultrasound presenting for routine prenatal visit  Plan   pregnancy6 Problems (from 02/05/17 to present)    Problem Noted Resolved    Supervision of other normal pregnancy, antepartum 05/14/2017 by Natale Milch, MD No   Overview Addendum 07/09/2017  3:17 PM by Natale Milch, MD      [facility]inic [facility] Prenatal Labs  Dating  Bedside US Blood type: O/Negative/-- (01/31 1634)   Genetic Screen NIPT: normal XY Antibody:Negative (01/31 1634)  Anatomic [place] Complete [ ]  fetal echo Rubella: 1.89 (01/31 1634) Varicella: Immune  GTT Early:        28 wk:      RPR: Non Reactive (01/31 1634)   Rhogam  HBsAg: Negative (01/31 1634)   TDaP vaccine                       HIV: Non Reactive (01/31 1634)   Flu Shot   06/10/17                             GBS:   Contraception  Depo? Pap: NIL  CBB   Given information   CS/VBAC Not applicable   Baby Food  Breast   Support Person             Rh negative state in antepartum period 05/14/2017 by Natale Milch, MD No   Overview Signed 05/14/2017  5:53 PM by Natale Milch, MD    [ ]  rhogam at 28wk appointment          Gestational age appropriate obstetric precautions including but not limited to vaginal bleeding, contractions, leaking of fluid and fetal movement were reviewed in detail with the patient.    Ordered fetal echo for family history of heart defect Patient reports excessive sweating of hands, feet,  armpits, will check TSH Given information on birth control options, patient has decided against tubal ligation considering Depo Provera Given information on volunteer doula program. Given information on cord blood banking  Return in about 3 weeks (around 07/30/2017) for [name], 28 week labs, 1GTT.  Adelene Idlerhristanna Schuman MD [facility], [facility] 07/09/2017, 3:18 PM

## 2017-07-10 LAB — TSH: TSH: 1.13 u[IU]/mL (ref 0.450–4.500)

## 2017-07-10 NOTE — Progress Notes (Signed)
Normal lab, called patient, no answer on phone.

## 2017-07-30 ENCOUNTER — Ambulatory Visit (INDEPENDENT_AMBULATORY_CARE_PROVIDER_SITE_OTHER): Payer: Medicaid Other | Admitting: Certified Nurse Midwife

## 2017-07-30 ENCOUNTER — Other Ambulatory Visit: Payer: Medicaid Other

## 2017-07-30 VITALS — BP 100/36 | Wt 138.0 lb

## 2017-07-30 DIAGNOSIS — Z3A28 28 weeks gestation of pregnancy: Secondary | ICD-10-CM

## 2017-07-30 DIAGNOSIS — Z3480 Encounter for supervision of other normal pregnancy, unspecified trimester: Secondary | ICD-10-CM

## 2017-07-30 DIAGNOSIS — F1290 Cannabis use, unspecified, uncomplicated: Secondary | ICD-10-CM

## 2017-07-30 DIAGNOSIS — O26899 Other specified pregnancy related conditions, unspecified trimester: Secondary | ICD-10-CM

## 2017-07-30 DIAGNOSIS — O1210 Gestational proteinuria, unspecified trimester: Secondary | ICD-10-CM

## 2017-07-30 DIAGNOSIS — Z6791 Unspecified blood type, Rh negative: Secondary | ICD-10-CM

## 2017-07-30 MED ORDER — RHO D IMMUNE GLOBULIN 1500 UNIT/2ML IJ SOSY
300.0000 ug | PREFILLED_SYRINGE | Freq: Once | INTRAMUSCULAR | Status: AC
  Administered 2017-07-30: 300 ug via INTRAMUSCULAR

## 2017-07-30 MED ORDER — DOXYLAMINE-PYRIDOXINE 10-10 MG PO TBEC: 2.0000 | DELAYED_RELEASE_TABLET | Freq: Every day | ORAL | 2 refills | Status: DC

## 2017-07-30 NOTE — Progress Notes (Signed)
[name] at 28wk 5days: Good FM. C/O nausea-uses MJ for nausea/ used MJ prior to pregnancy also. Having 28 week labs and needs Rhogam-is O negative blood type. +1 proteinuria for second time. Denies dysuria or hematuria or frequency. Urine tea colored. Has not scheduled fetal echo yet. Scheduler was not able to reach her-as her phone # was changed. Lost letter that was sent to her with phone # to schedule echo. [facility] gave her the phone # today. O: see flow sheet/ BP 100/36. FHTs WNL A: IUP at 28 weeks 5 days. Rhogam candidate. 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 [name] in 2 weeks Farrel Connersolleen Jaiven Graveline, CNM

## 2017-07-30 NOTE — Progress Notes (Signed)
[name] 28 weeks lab 

## 2017-07-31 LAB — 28 WEEKS RH-PANEL
Antibody Screen: NEGATIVE
Basophils Absolute: 0 x10E3/uL (ref 0.0–0.2)
Basos: 0 %
EOS (ABSOLUTE): 0 x10E3/uL (ref 0.0–0.4)
Eos: 1 %
Gestational Diabetes Screen: 145 mg/dL — ABNORMAL HIGH (ref 65–139)
HIV Screen 4th Generation wRfx: NONREACTIVE
Hematocrit: 32.1 % — ABNORMAL LOW (ref 34.0–46.6)
Hemoglobin: 11 g/dL — ABNORMAL LOW (ref 11.1–15.9)
Immature Grans (Abs): 0 x10E3/uL (ref 0.0–0.1)
Immature Granulocytes: 1 %
Lymphocytes Absolute: 0.8 x10E3/uL (ref 0.7–3.1)
Lymphs: 11 %
MCH: 30.6 pg (ref 26.6–33.0)
MCHC: 34.3 g/dL (ref 31.5–35.7)
MCV: 89 fL (ref 79–97)
Monocytes Absolute: 0.5 x10E3/uL (ref 0.1–0.9)
Monocytes: 7 %
Neutrophils Absolute: 5.6 x10E3/uL (ref 1.4–7.0)
Neutrophils: 80 %
Platelets: 163 x10E3/uL (ref 150–379)
RBC: 3.59 x10E6/uL — ABNORMAL LOW (ref 3.77–5.28)
RDW: 12.8 % (ref 12.3–15.4)
RPR Ser Ql: NONREACTIVE
WBC: 7 x10E3/uL (ref 3.4–10.8)

## 2017-07-31 LAB — PROTEIN / CREATININE RATIO, URINE
CREATININE, UR: 255.6 mg/dL
PROTEIN UR: 44 mg/dL
Protein/Creat Ratio: 172 mg/g creat (ref 0–200)

## 2017-07-31 IMAGING — US US RENAL
1 series · 14 of 25 positions shown · non-contrast
Comparison: None.

CLINICAL DATA: Left-sided flank pain

EXAM:
RENAL / URINARY TRACT ULTRASOUND COMPLETE

[Series 1: us renal · 0.22mm/px · 14 of 41 slices shown]
[im 1/41]
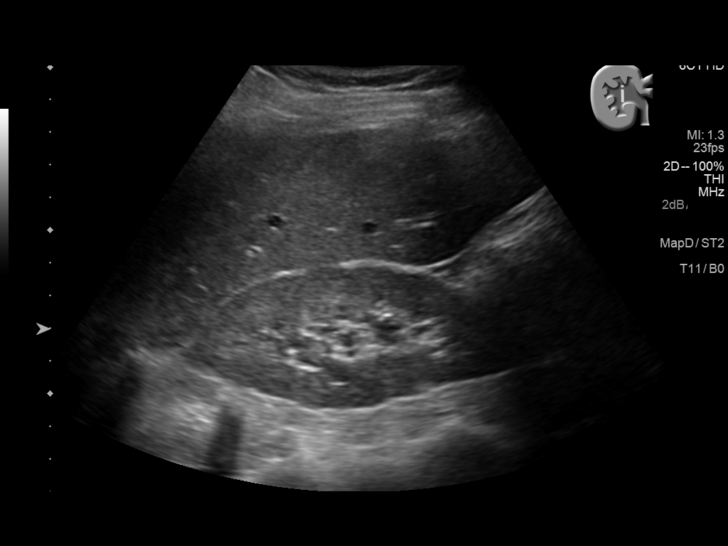
[im 4/41]
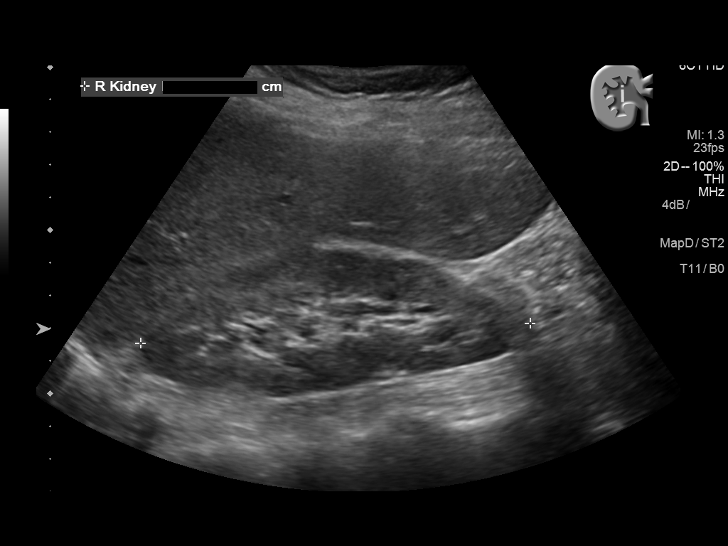
[im 7/41]
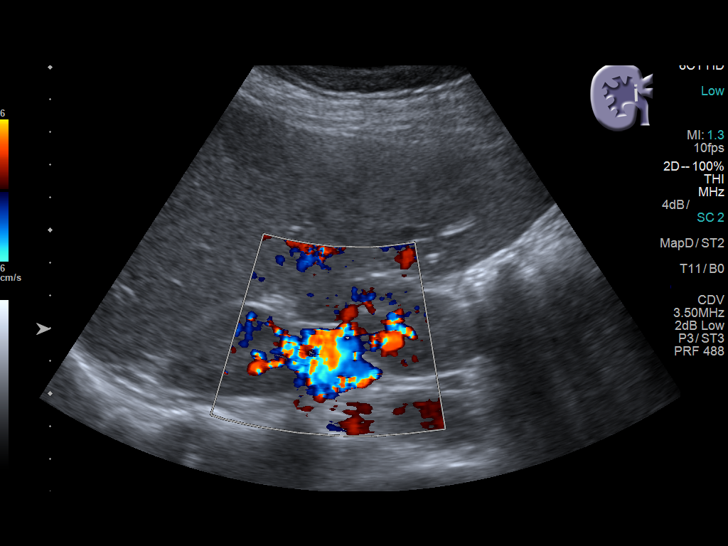
[im 11/41]
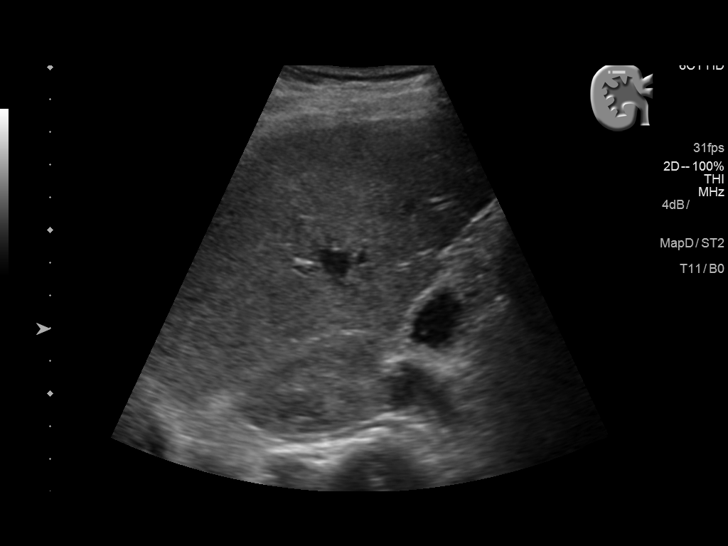
[im 14/41]
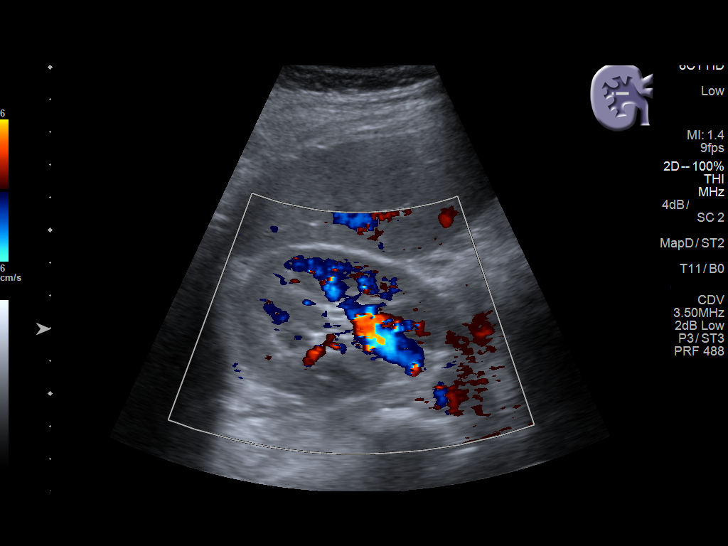
[im 16/41]
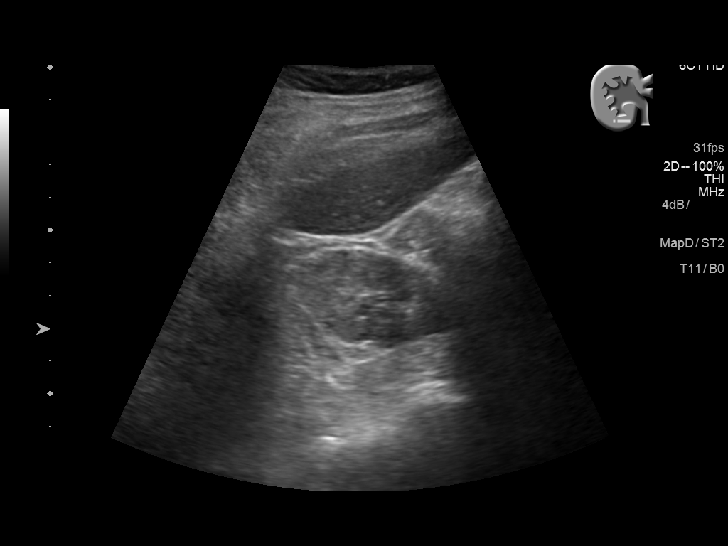
[im 19/41]
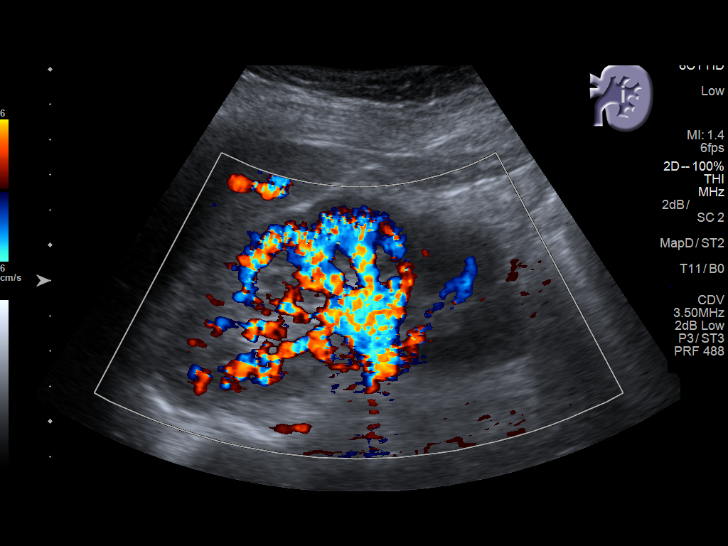
[im 22/41]
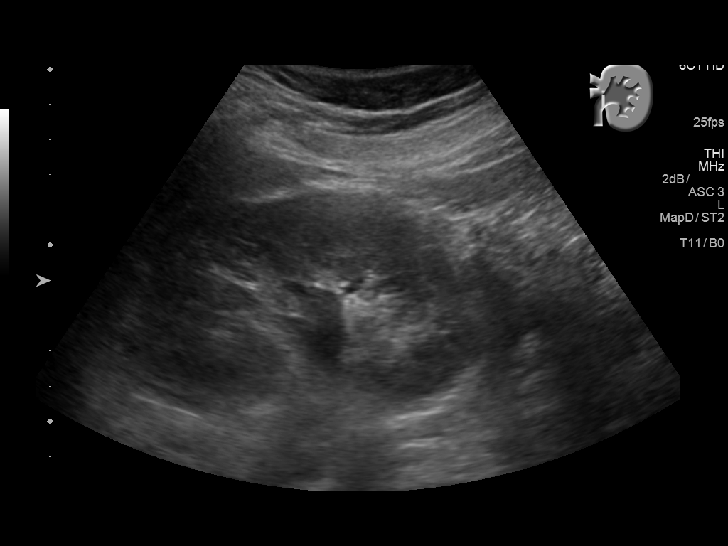
[im 26/41]
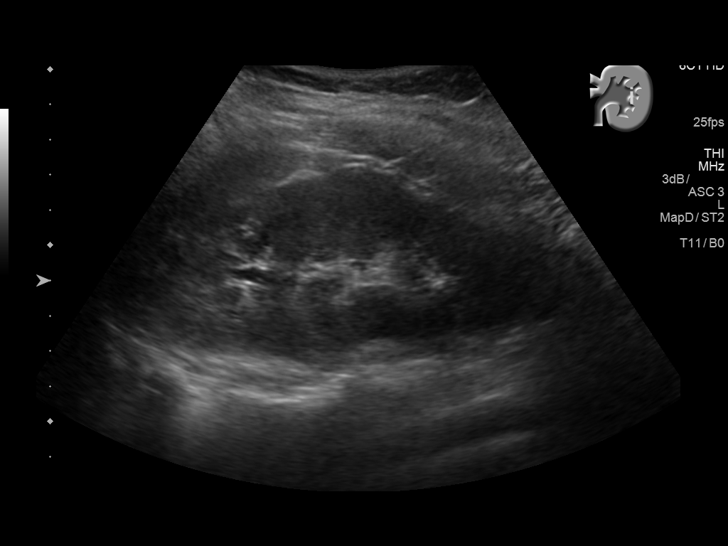
[im 27/41]
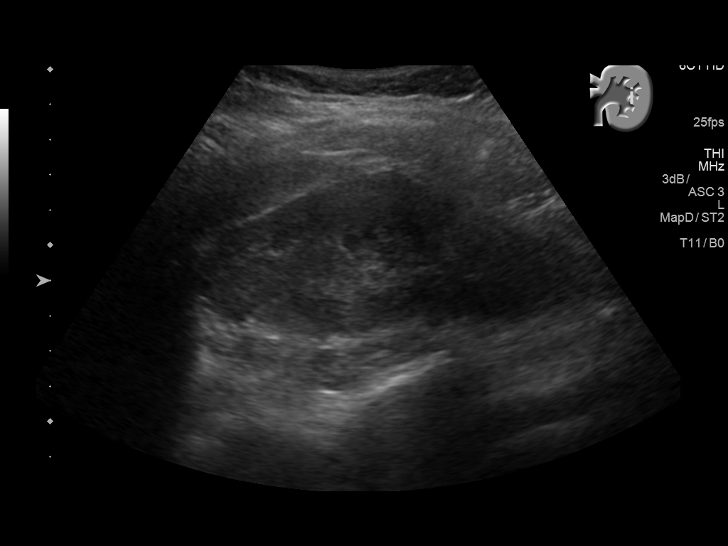
[im 31/41]
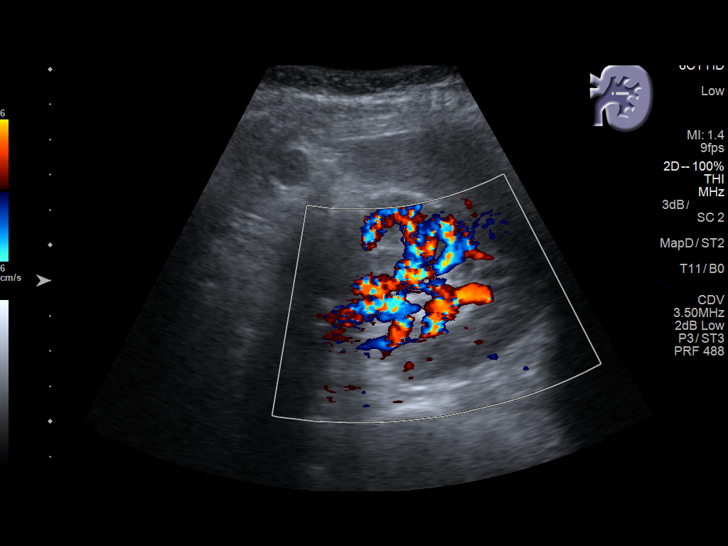
[im 34/41]
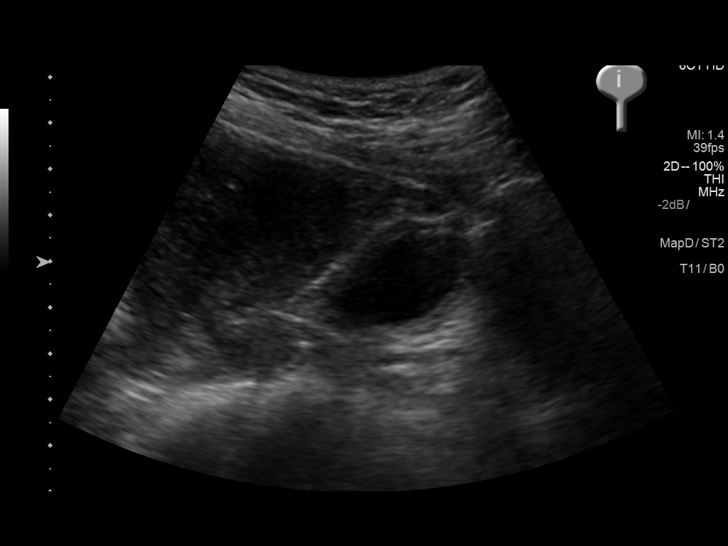
[im 37/41]
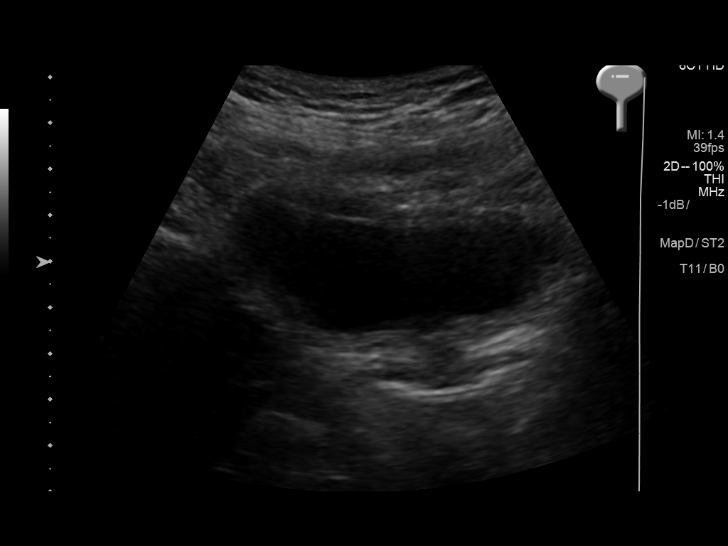
[im 41/41]
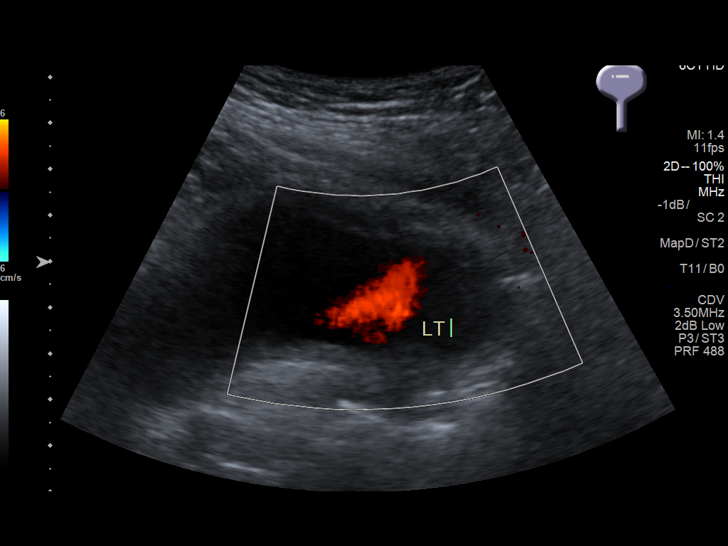

[14 of 25 positions shown; findings below may reference images not displayed]

FINDINGS: Right Kidney:

Length: 11.9 cm.. Echogenicity within normal limits. No mass or
hydronephrosis visualized.

Left Kidney:

Length: 12.9 cm.. Echogenicity within normal limits. No mass or
hydronephrosis visualized.

Bladder:

Somewhat decompressed with prominence of the bladder wall likely
related to the decompression.
IMPRESSION: No acute abnormality noted.

## 2017-08-01 LAB — URINE CULTURE

## 2017-08-05 ENCOUNTER — Other Ambulatory Visit: Payer: Self-pay | Admitting: Obstetrics and Gynecology

## 2017-08-05 DIAGNOSIS — R7309 Other abnormal glucose: Secondary | ICD-10-CM

## 2017-08-05 NOTE — Progress Notes (Signed)
Patient needs a 3 hr GTT. Please call and ask her to come in for this lab test as soon as possible.  Thank you, Dr. Jerene PitchSchuman

## 2017-08-06 ENCOUNTER — Other Ambulatory Visit: Payer: Medicaid Other

## 2017-08-06 ENCOUNTER — Other Ambulatory Visit: Payer: Self-pay | Admitting: Obstetrics and Gynecology

## 2017-08-06 DIAGNOSIS — R7309 Other abnormal glucose: Secondary | ICD-10-CM

## 2017-08-07 LAB — GESTATIONAL GLUCOSE TOLERANCE
GLUCOSE 1 HOUR GTT: 112 mg/dL (ref 65–179)
GLUCOSE FASTING: 77 mg/dL (ref 65–94)
Glucose, GTT - 2 Hour: 95 mg/dL (ref 65–154)
Glucose, GTT - 3 Hour: 74 mg/dL (ref 65–139)

## 2017-08-10 NOTE — Progress Notes (Signed)
Please call and let Shatavia know that her 3hour gtt was normal. Thank you, Dr. Jerene Pitch

## 2017-08-14 ENCOUNTER — Encounter: Payer: Medicaid Other | Admitting: Advanced Practice Midwife

## 2017-08-20 ENCOUNTER — Encounter: Payer: Medicaid Other | Admitting: Obstetrics and Gynecology

## 2017-08-20 DIAGNOSIS — Z8279 Family history of other congenital malformations, deformations and chromosomal abnormalities: Secondary | ICD-10-CM | POA: Insufficient documentation

## 2017-08-28 ENCOUNTER — Encounter: Payer: Medicaid Other | Admitting: Obstetrics and Gynecology

## 2017-09-09 ENCOUNTER — Ambulatory Visit (INDEPENDENT_AMBULATORY_CARE_PROVIDER_SITE_OTHER): Payer: Medicaid Other | Admitting: Maternal Newborn

## 2017-09-09 ENCOUNTER — Encounter: Payer: Self-pay | Admitting: Maternal Newborn

## 2017-09-09 VITALS — BP 110/64 | Wt 138.0 lb

## 2017-09-09 DIAGNOSIS — Z3A34 34 weeks gestation of pregnancy: Secondary | ICD-10-CM

## 2017-09-09 DIAGNOSIS — Z3480 Encounter for supervision of other normal pregnancy, unspecified trimester: Secondary | ICD-10-CM

## 2017-09-09 NOTE — Progress Notes (Signed)
No concerns.rj 

## 2017-09-09 NOTE — Progress Notes (Signed)
    Routine Prenatal Care Visit  Subjective  Mary [name] is a 27 y.o. [phone] at [redacted]w[redacted]d being seen today for ongoing prenatal care.  She is currently monitored for the following issues for this low-risk pregnancy and has Supervision of other normal pregnancy, antepartum; Rh negative state in antepartum period; and Marijuana use on their problem list.  ----------------------------------------------------------------------------------- Patient reports no complaints.   Contractions: Not present. Vag. Bleeding: None.  Movement: Present. No leaking of fluid.  ----------------------------------------------------------------------------------- The following portions of the patient's history were reviewed and updated as appropriate: allergies, current medications, past family history, past medical history, past social history, past surgical history and problem list. Problem list updated.   Objective  Blood pressure 110/64, weight 138 lb (62.6 kg), last menstrual period 02/06/2017, unknown if currently breastfeeding. Pregravid weight 119 lb (54 kg) Total Weight Gain 19 lb (8.618 kg) Urinalysis: Urine Protein: Negative Urine Glucose: Negative  Fetal Status: Fetal Heart Rate (bpm): 152 Fundal Height: 34 cm Movement: Present     General:  Alert, oriented and cooperative. Patient is in no acute distress.  Skin: Skin is warm and dry. No rash noted.   Cardiovascular: Normal heart rate noted  Respiratory: Normal respiratory effort, no problems with respiration noted  Abdomen: Soft, gravid, appropriate for gestational age. Pain/Pressure: Absent     Pelvic:  Cervical exam deferred        Extremities: Normal range of motion.  Edema: None  Mental Status: Normal mood and affect. Normal behavior. Normal judgment and thought content.     Assessment   26 y.o. [id number] at [redacted]w[redacted]d, EDD 10/17/2017 by Ultrasound presenting for routine prenatal visit.  Plan   [email or web address] Problems (from 02/05/17 to  present)    Problem Noted Resolved   Supervision of other normal pregnancy, antepartum 05/14/2017 by Natale Milch, MD No   Overview Addendum 07/30/2017  5:15 PM by Farrel Conners, CNM      Clinic [facility] Prenatal Labs  Dating  Bedside US Blood type: O/Negative/-- (01/31 1634)   Genetic Screen NIPT: normal XY Antibody:Negative (01/31 1634)  Anatomic [place] Complete  fetal echo Rubella: 1.89 (01/31 1634) Varicella: Immune  GTT Early:        28 wk:      RPR: Non Reactive (01/31 1634)   Rhogam 07/30/17 HBsAg: Negative (01/31 1634)   TDaP vaccine                       HIV: Non Reactive (01/31 1634)   Flu Shot   06/10/17                             GBS:   Contraception  Depo? Pap: NIL  CBB   Given information   CS/VBAC Not applicable   Baby Food  Breast   Support Person             Rh negative state in antepartum period 05/14/2017 by Natale Milch, MD No   Overview Signed 05/14/2017  5:53 PM by Natale Milch, MD     rhogam at 28wk appointment         Preterm labor symptoms and general obstetric precautions including but not limited to vaginal bleeding, contractions, leaking of fluid and fetal movement were reviewed.  Return in about 2 weeks (around 09/23/2017) for [name].  Marcelyn Bruins, CNM 09/09/2017  9:54 AM

## 2017-09-23 ENCOUNTER — Encounter: Payer: Medicaid Other | Admitting: Certified Nurse Midwife

## 2017-10-01 ENCOUNTER — Encounter: Payer: Medicaid Other | Admitting: Advanced Practice Midwife

## 2017-10-05 ENCOUNTER — Telehealth: Payer: Self-pay

## 2017-10-05 NOTE — Telephone Encounter (Signed)
CM made contact with member today due to member having needs.  CM discussed importance of keeping her OB appointments to have a healthy birth outcome. CM encouraged member to schedule a [name] appointment and cm will f/u with member at her next scheduled appointment.  Member agreeable.

## 2017-10-07 ENCOUNTER — Encounter: Payer: Self-pay | Admitting: Advanced Practice Midwife

## 2017-10-07 ENCOUNTER — Ambulatory Visit (INDEPENDENT_AMBULATORY_CARE_PROVIDER_SITE_OTHER): Payer: Medicaid Other | Admitting: Advanced Practice Midwife

## 2017-10-07 VITALS — BP 100/60 | Wt 139.0 lb

## 2017-10-07 DIAGNOSIS — O360130 Maternal care for anti-D [Rh] antibodies, third trimester, not applicable or unspecified: Secondary | ICD-10-CM

## 2017-10-07 DIAGNOSIS — Z113 Encounter for screening for infections with a predominantly sexual mode of transmission: Secondary | ICD-10-CM

## 2017-10-07 DIAGNOSIS — Z3A38 38 weeks gestation of pregnancy: Secondary | ICD-10-CM

## 2017-10-07 DIAGNOSIS — Z3685 Encounter for antenatal screening for Streptococcus B: Secondary | ICD-10-CM

## 2017-10-07 NOTE — Patient Instructions (Signed)
Vaginal Delivery Vaginal delivery means that you will give birth by pushing your baby out of your birth canal (vagina). A team of health care providers will help you before, during, and after vaginal delivery. Birth experiences are unique for every woman and every pregnancy, and birth experiences vary depending on where you choose to give birth. What should I do to prepare for my baby's birth? Before your baby is born, it is important to talk with your health care provider about:  Your labor and delivery preferences. These may include: ? Medicines that you may be given. ? How you will manage your pain. This might include non-medical pain relief techniques or injectable pain relief such as epidural analgesia. ? How you and your baby will be monitored during labor and delivery. ? Who may be in the labor and delivery room with you. ? Your feelings about surgical delivery of your baby (cesarean delivery, or C-section) if this becomes necessary. ? Your feelings about receiving donated blood through an IV tube (blood transfusion) if this becomes necessary.  Whether you are able: ? To take pictures or videos of the birth. ? To eat during labor and delivery. ? To move around, walk, or change positions during labor and delivery.  What to expect after your baby is born, such as: ? Whether delayed umbilical cord clamping and cutting is offered. ? Who will care for your baby right after birth. ? Medicines or tests that may be recommended for your baby. ? Whether breastfeeding is supported in your hospital or birth center. ? How long you will be in the hospital or birth center.  How any medical conditions you have may affect your baby or your labor and delivery experience.  To prepare for your baby's birth, you should also:  Attend all of your health care visits before delivery (prenatal visits) as recommended by your health care provider. This is important.  Prepare your home for your baby's  arrival. Make sure that you have: ? Diapers. ? Baby clothing. ? Feeding equipment. ? Safe sleeping arrangements for you and your baby.  Install a car seat in your vehicle. Have your car seat checked by a certified car seat installer to make sure that it is installed safely.  Think about who will help you with your new baby at home for at least the first several weeks after delivery.  What can I expect when I arrive at the birth center or hospital? Once you are in labor and have been admitted into the hospital or birth center, your health care provider may:  Review your pregnancy history and any concerns you have.  Insert an IV tube into one of your veins. This is used to give you fluids and medicines.  Check your blood pressure, pulse, temperature, and heart rate (vital signs).  Check whether your bag of water (amniotic sac) has broken (ruptured).  Talk with you about your birth plan and discuss pain control options.  Monitoring Your health care provider may monitor your contractions (uterine monitoring) and your baby's heart rate (fetal monitoring). You may need to be monitored:  Often, but not continuously (intermittently).  All the time or for long periods at a time (continuously). Continuous monitoring may be needed if: ? You are taking certain medicines, such as medicine to relieve pain or make your contractions stronger. ? You have pregnancy or labor complications.  Monitoring may be done by:  Placing a special stethoscope or a handheld monitoring device on your abdomen to   check your baby's heartbeat, and feeling your abdomen for contractions. This method of monitoring does not continuously record your baby's heartbeat or your contractions.  Placing monitors on your abdomen (external monitors) to record your baby's heartbeat and the frequency and length of contractions. You may not have to wear external monitors all the time.  Placing monitors inside of your uterus  (internal monitors) to record your baby's heartbeat and the frequency, length, and strength of your contractions. ? Your health care provider may use internal monitors if he or she needs more information about the strength of your contractions or your baby's heart rate. ? Internal monitors are put in place by passing a thin, flexible wire through your vagina and into your uterus. Depending on the type of monitor, it may remain in your uterus or on your baby's head until birth. ? Your health care provider will discuss the benefits and risks of internal monitoring with you and will ask for your permission before inserting the monitors.  Telemetry. This is a type of continuous monitoring that can be done with external or internal monitors. Instead of having to stay in bed, you are able to move around during telemetry. Ask your health care provider if telemetry is an option for you.  Physical exam Your health care provider may perform a physical exam. This may include:  Checking whether your baby is positioned: ? With the head toward your vagina (head-down). This is most common. ? With the head toward the top of your uterus (head-up or breech). If your baby is in a breech position, your health care provider may try to turn your baby to a head-down position so you can deliver vaginally. If it does not seem that your baby can be born vaginally, your provider may recommend surgery to deliver your baby. In rare cases, you may be able to deliver vaginally if your baby is head-up (breech delivery). ? Lying sideways (transverse). Babies that are lying sideways cannot be delivered vaginally.  Checking your cervix to determine: ? Whether it is thinning out (effacing). ? Whether it is opening up (dilating). ? How low your baby has moved into your birth canal.  What are the three stages of labor and delivery?  Normal labor and delivery is divided into the following three stages: Stage 1  Stage 1 is the  longest stage of labor, and it can last for hours or days. Stage 1 includes: ? Early labor. This is when contractions may be irregular, or regular and mild. Generally, early labor contractions are more than 10 minutes apart. ? Active labor. This is when contractions get longer, more regular, more frequent, and more intense. ? The transition phase. This is when contractions happen very close together, are very intense, and may last longer than during any other part of labor.  Contractions generally feel mild, infrequent, and irregular at first. They get stronger, more frequent (about every 2-3 minutes), and more regular as you progress from early labor through active labor and transition.  Many women progress through stage 1 naturally, but you may need help to continue making progress. If this happens, your health care provider may talk with you about: ? Rupturing your amniotic sac if it has not ruptured yet. ? Giving you medicine to help make your contractions stronger and more frequent.  Stage 1 ends when your cervix is completely dilated to 4 inches (10 cm) and completely effaced. This happens at the end of the transition phase. Stage 2  Once   your cervix is completely effaced and dilated to 4 inches (10 cm), you may start to feel an urge to push. It is common for the body to naturally take a rest before feeling the urge to push, especially if you received an epidural or certain other pain medicines. This rest period may last for up to 1-2 hours, depending on your unique labor experience.  During stage 2, contractions are generally less painful, because pushing helps relieve contraction pain. Instead of contraction pain, you may feel stretching and burning pain, especially when the widest part of your baby's head passes through the vaginal opening (crowning).  Your health care provider will closely monitor your pushing progress and your baby's progress through the vagina during stage 2.  Your  health care provider may massage the area of skin between your vaginal opening and anus (perineum) or apply warm compresses to your perineum. This helps it stretch as the baby's head starts to crown, which can help prevent perineal tearing. ? In some cases, an incision may be made in your perineum (episiotomy) to allow the baby to pass through the vaginal opening. An episiotomy helps to make the opening of the vagina larger to allow more room for the baby to fit through.  It is very important to breathe and focus so your health care provider can control the delivery of your baby's head. Your health care provider may have you decrease the intensity of your pushing, to help prevent perineal tearing.  After delivery of your baby's head, the shoulders and the rest of the body generally deliver very quickly and without difficulty.  Once your baby is delivered, the umbilical cord may be cut right away, or this may be delayed for 1-2 minutes, depending on your baby's health. This may vary among health care providers, hospitals, and birth centers.  If you and your baby are healthy enough, your baby may be placed on your chest or abdomen to help maintain the baby's temperature and to help you bond with each other. Some mothers and babies start breastfeeding at this time. Your health care team will dry your baby and help keep your baby warm during this time.  Your baby may need immediate care if he or she: ? Showed signs of distress during labor. ? Has a medical condition. ? Was born too early (prematurely). ? Had a bowel movement before birth (meconium). ? Shows signs of difficulty transitioning from being inside the uterus to being outside of the uterus. If you are planning to breastfeed, your health care team will help you begin a feeding. Stage 3  The third stage of labor starts immediately after the birth of your baby and ends after you deliver the placenta. The placenta is an organ that develops  during pregnancy to provide oxygen and nutrients to your baby in the womb.  Delivering the placenta may require some pushing, and you may have mild contractions. Breastfeeding can stimulate contractions to help you deliver the placenta.  After the placenta is delivered, your uterus should tighten (contract) and become firm. This helps to stop bleeding in your uterus. To help your uterus contract and to control bleeding, your health care provider may: ? Give you medicine by injection, through an IV tube, by mouth, or through your rectum (rectally). ? Massage your abdomen or perform a vaginal exam to remove any blood clots that are left in your uterus. ? Empty your bladder by placing a thin, flexible tube (catheter) into your bladder. ? Encourage   you to breastfeed your baby. After labor is over, you and your baby will be monitored closely to ensure that you are both healthy until you are ready to go home. Your health care team will teach you how to care for yourself and your baby. This information is not intended to replace advice given to you by your health care provider. Make sure you discuss any questions you have with your health care provider. Document Released: 01/08/2008 Document Revised: 10/19/2015 Document Reviewed: 04/15/2015 Elsevier Interactive Patient Education  2018 [employer]  

## 2017-10-07 NOTE — Progress Notes (Signed)
Routine Prenatal Care Visit  Subjective  Mary [name] is a 27 y.o. [phone] at [redacted]w[redacted]d being seen today for ongoing prenatal care.  She is currently monitored for the following issues for this low-risk pregnancy and has Supervision of other normal pregnancy, antepartum; Rh negative state in antepartum period; Marijuana use; and Family history of first degree relative with congenital heart disease on their problem list.  ----------------------------------------------------------------------------------- Patient reports no complaints.   Contractions: Irregular. Vag. Bleeding: None.  Movement: Present. Denies leaking of fluid.  ----------------------------------------------------------------------------------- The following portions of the patient's history were reviewed and updated as appropriate: allergies, current medications, past family history, past medical history, past social history, past surgical history and problem list. Problem list updated.   Objective  Blood pressure 100/60, weight 139 lb (63 kg), last menstrual period 02/06/2017, unknown if currently breastfeeding. Pregravid weight 119 lb (54 kg) Total Weight Gain 20 lb (9.072 kg) Urinalysis: Urine Protein: Negative Urine Glucose: Negative  Fetal Status: Fetal Heart Rate (bpm): 145 Fundal Height: 38 cm Movement: Present  Presentation: Vertex  General:  Alert, oriented and cooperative. Patient is in no acute distress.  Skin: Skin is warm and dry. No rash noted.   Cardiovascular: Normal heart rate noted  Respiratory: Normal respiratory effort, no problems with respiration noted  Abdomen: Soft, gravid, appropriate for gestational age. Pain/Pressure: Absent     Pelvic:  Cervical exam performed Dilation: 3 Effacement (%): 50 Station: -2, GBS/aptima today  Extremities: Normal range of motion.  Edema: None  Mental Status: Normal mood and affect. Normal behavior. Normal judgment and thought content.   Assessment   27 y.o.  [id number] at [redacted]w[redacted]d by  10/17/2017, by Ultrasound presenting for routine prenatal visit  Plan   pregnancy6 Problems (from 02/05/17 to present)    Problem Noted Resolved   Supervision of other normal pregnancy, antepartum 05/14/2017 by Natale Milch, MD No   Overview Addendum 07/30/2017  5:15 PM by Farrel Conners, CNM      Clinic [facility] Prenatal Labs  Dating  Bedside US Blood type: O/Negative/-- (01/31 1634)   Genetic Screen NIPT: normal XY Antibody:Negative (01/31 1634)  Anatomic [place] Complete [ ]  fetal echo Rubella: 1.89 (01/31 1634) Varicella: Immune  GTT Early:        28 wk:      RPR: Non Reactive (01/31 1634)   Rhogam 07/30/17 HBsAg: Negative (01/31 1634)   TDaP vaccine                       HIV: Non Reactive (01/31 1634)   Flu Shot   06/10/17                             GBS:   Contraception  Depo? Pap: NIL  CBB   Given information   CS/VBAC Not applicable   Baby Food  Breast   Support Person             Rh negative state in antepartum period 05/14/2017 by Natale Milch, MD No   Overview Signed 05/14/2017  5:53 PM by Natale Milch, MD    [X]  rhogam at 28wk appointment          Term labor symptoms and general obstetric precautions including but not limited to vaginal bleeding, contractions, leaking of fluid and fetal movement were reviewed in detail with the patient. Please refer to After Visit Summary for other counseling recommendations.   Return  in about 1 week (around 10/14/2017) for rob.  Tresea MallJane Aubra Pappalardo, CNM 10/07/2017 12:04 PM

## 2017-10-07 NOTE — Progress Notes (Signed)
Pt reports no problems. GBS/aptima today.  

## 2017-10-09 LAB — STREP GP B NAA: Strep Gp B NAA: NEGATIVE

## 2017-10-12 ENCOUNTER — Other Ambulatory Visit: Payer: Self-pay

## 2017-10-12 ENCOUNTER — Encounter: Payer: Self-pay | Admitting: Obstetrics and Gynecology

## 2017-10-12 ENCOUNTER — Inpatient Hospital Stay
Admission: EM | Admit: 2017-10-12 | Discharge: 2017-10-14 | DRG: 806 | Disposition: A | Discharge status: Home / Self Care | Payer: Medicaid Other | Attending: Obstetrics & Gynecology | Admitting: Obstetrics & Gynecology

## 2017-10-12 DIAGNOSIS — Z3483 Encounter for supervision of other normal pregnancy, third trimester: Secondary | ICD-10-CM | POA: Diagnosis present

## 2017-10-12 DIAGNOSIS — Z6791 Unspecified blood type, Rh negative: Secondary | ICD-10-CM | POA: Diagnosis not present

## 2017-10-12 DIAGNOSIS — O99324 Drug use complicating childbirth: Secondary | ICD-10-CM | POA: Diagnosis present

## 2017-10-12 DIAGNOSIS — F1290 Cannabis use, unspecified, uncomplicated: Secondary | ICD-10-CM | POA: Diagnosis not present

## 2017-10-12 DIAGNOSIS — O26893 Other specified pregnancy related conditions, third trimester: Secondary | ICD-10-CM | POA: Diagnosis present

## 2017-10-12 DIAGNOSIS — Z3480 Encounter for supervision of other normal pregnancy, unspecified trimester: Secondary | ICD-10-CM

## 2017-10-12 DIAGNOSIS — Z87891 Personal history of nicotine dependence: Secondary | ICD-10-CM

## 2017-10-12 DIAGNOSIS — Z3A39 39 weeks gestation of pregnancy: Secondary | ICD-10-CM | POA: Diagnosis not present

## 2017-10-12 DIAGNOSIS — O26899 Other specified pregnancy related conditions, unspecified trimester: Secondary | ICD-10-CM

## 2017-10-12 LAB — URINE DRUG PANEL 7
Amphetamines, Urine: NEGATIVE ng/mL
BARBITURATE QUANT UR: NEGATIVE ng/mL
BENZODIAZEPINE QUANT UR: NEGATIVE ng/mL
CANNABINOID QUANT UR: POSITIVE — AB
COCAINE (METAB.): NEGATIVE ng/mL
Opiate Quant, Ur: NEGATIVE ng/mL
PCP Quant, Ur: NEGATIVE ng/mL

## 2017-10-12 LAB — URINE DRUG SCREEN, QUALITATIVE (ARMC ONLY)
Amphetamines, Ur Screen: NOT DETECTED
BENZODIAZEPINE, UR SCRN: NOT DETECTED
CANNABINOID 50 NG, UR ~~LOC~~: POSITIVE — AB
Cocaine Metabolite,Ur ~~LOC~~: NOT DETECTED
MDMA (Ecstasy)Ur Screen: NOT DETECTED
Methadone Scn, Ur: NOT DETECTED
Opiate, Ur Screen: NOT DETECTED
Phencyclidine (PCP) Ur S: NOT DETECTED
TRICYCLIC, UR SCREEN: NOT DETECTED

## 2017-10-12 LAB — CHLAMYDIA/GONOCOCCUS/TRICHOMONAS, NAA
Chlamydia by NAA: NEGATIVE
Gonococcus by NAA: NEGATIVE
Trich vag by NAA: NEGATIVE

## 2017-10-12 LAB — CBC
HCT: 35.2 % (ref 35.0–47.0)
Hemoglobin: 12.1 g/dL (ref 12.0–16.0)
MCH: 30.2 pg (ref 26.0–34.0)
MCHC: 34.3 g/dL (ref 32.0–36.0)
MCV: 88.2 fL (ref 80.0–100.0)
Platelets: 157 10*3/uL (ref 150–440)
RBC: 4 MIL/uL (ref 3.80–5.20)
RDW: 12.1 % (ref 11.5–14.5)
WBC: 12.1 10*3/uL — ABNORMAL HIGH (ref 3.6–11.0)

## 2017-10-12 MED ORDER — OXYTOCIN 40 UNITS IN LACTATED RINGERS INFUSION - SIMPLE MED
INTRAVENOUS | Status: AC
  Filled 2017-10-12: qty 1000

## 2017-10-12 MED ORDER — MISOPROSTOL 200 MCG PO TABS
ORAL_TABLET | ORAL | Status: AC
  Filled 2017-10-12: qty 4

## 2017-10-12 MED ORDER — ONDANSETRON HCL 4 MG/2ML IJ SOLN
4.0000 mg | Freq: Four times a day (QID) | INTRAMUSCULAR | Status: DC | PRN
  Administered 2017-10-12 – 2017-10-13 (×2): 4 mg via INTRAVENOUS
  Filled 2017-10-12 (×2): qty 2

## 2017-10-12 MED ORDER — OXYTOCIN 40 UNITS IN LACTATED RINGERS INFUSION - SIMPLE MED: 2.5000 [IU]/h | INTRAVENOUS | Status: DC

## 2017-10-12 MED ORDER — FENTANYL 2.5 MCG/ML W/ROPIVACAINE 0.15% IN NS 100 ML EPIDURAL (ARMC)
EPIDURAL | Status: AC
  Filled 2017-10-12: qty 100

## 2017-10-12 MED ORDER — FENTANYL CITRATE (PF) 100 MCG/2ML IJ SOLN: 50.0000 ug | INTRAMUSCULAR | Status: DC | PRN

## 2017-10-12 MED ORDER — AMMONIA AROMATIC IN INHA
RESPIRATORY_TRACT | Status: AC
  Filled 2017-10-12: qty 10

## 2017-10-12 MED ORDER — LACTATED RINGERS IV SOLN
500.0000 mL | INTRAVENOUS | Status: DC | PRN
  Administered 2017-10-12: 500 mL via INTRAVENOUS
  Administered 2017-10-12: 1000 mL via INTRAVENOUS
  Administered 2017-10-13: 500 mL via INTRAVENOUS

## 2017-10-12 MED ORDER — LACTATED RINGERS IV SOLN
INTRAVENOUS | Status: DC
  Administered 2017-10-12: 23:00:00 via INTRAVENOUS

## 2017-10-12 MED ORDER — OXYTOCIN 10 UNIT/ML IJ SOLN
INTRAMUSCULAR | Status: AC
  Filled 2017-10-12: qty 2

## 2017-10-12 MED ORDER — LIDOCAINE HCL (PF) 1 % IJ SOLN
INTRAMUSCULAR | Status: AC
  Filled 2017-10-12: qty 30

## 2017-10-12 MED ORDER — OXYTOCIN BOLUS FROM INFUSION: 500.0000 mL | Freq: Once | INTRAVENOUS | Status: DC

## 2017-10-12 MED ORDER — ACETAMINOPHEN 325 MG PO TABS: 650.0000 mg | ORAL_TABLET | ORAL | Status: DC | PRN

## 2017-10-12 NOTE — OB Triage Note (Signed)
Pt presents to L&D with c/o contractions every 2-3 mins. 9/10 pain with contractions in abd. Pt denies vaginal bleeding or decreased fetal movement. Pt notes clear to white vaginal discharge, unsure if leaking fluid. Toco and EFM applied and explained. All questions answered. Will continue to monitor closely.

## 2017-10-12 NOTE — H&P (Signed)
Obstetrics Admission History & Physical     HPI:  27 y.o. [id number] @ [redacted]w[redacted]d (10/17/2017, by Ultrasound). Admitted on 10/12/2017:   Patient Active Problem List   Diagnosis Date Noted  . Normal labor 10/12/2017  . Family history of first degree relative with congenital heart disease 08/20/2017  . Marijuana use 07/30/2017  . Supervision of other normal pregnancy, antepartum 05/14/2017  . Rh negative state in antepartum period 05/14/2017    Presents for frequent contractions every 2-3 minutes since 1900. Rating pain 9/10. Uncertain if she is leaking fluid, has had some clear discharge. Nitrazine equivocal. Denies vaginal bleeding. Endorses good fetal movement.   Prenatal care at: at [facility]. Pregnancy complicated by Rh Negative blood type and marijuana use in pregnancy..  ROS: A review of systems was performed and negative, except as stated in the above HPI.  PMHx:  Past Medical History:  Diagnosis Date  . Anemia affecting pregnancy   . Gallstones   . GERD (gastroesophageal reflux disease)    PSHx:  Past Surgical History:  Procedure Laterality Date  . APPENDECTOMY    . LAPAROSCOPIC CHOLECYSTECTOMY  10/24/2014   [redacted] wks pregnant   Medications:  Medications Prior to Admission  Medication Sig Dispense Refill Last Dose  . Doxylamine-Pyridoxine (DICLEGIS) 10-10 MG TBEC Take 2 tablets by mouth at bedtime. If symptoms persist, add one tablet in the morning and one in the afternoon 100 tablet 2 Past Month at Unknown time  . PRENATAL 28-0.8 MG TABS Take by mouth.   10/12/2017 at Unknown time   Allergies: has No Known Allergies. OBHx:  OB History  Gravida Para Term Preterm AB Living  6 4 4  0 1 4  SAB TAB Ectopic Multiple Live Births  1 0 0 0 4    # Outcome Date GA Lbr Len/2nd Weight Sex Delivery Anes PTL Lv  6 Current           5 Term 05/16/15 [redacted]w[redacted]d 03:15 / 00:27 7 lb 12.2 oz (3.52 kg) M Vag-Spont EPI  LIV  4 Term 11/17/13 [redacted]w[redacted]d  8 lb 7 oz (3.827 kg) M Vag-Spont   LIV  3 Term 07/24/11  [redacted]w[redacted]d  8 lb 2 oz (3.685 kg) F Vag-Spont   LIV  2 Term 07/16/09 [redacted]w[redacted]d  7 lb 2 oz (3.232 kg) F Vag-Spont   LIV  1 SAB 2009 [redacted]w[redacted]d          JWJ:XBJYN than listed in HPI remarkable for: brother with heart defect; normal fetal echo; mother with hypertension, diabetes, and lupus; father with hypertension.  No family history of birth defects. Soc Hx: Former smoker, Alcohol: none and Recreational drug use: current: marijuana for nausea.  Objective:   Vitals:   10/12/17 2145  BP: 118/72  Pulse: 92  Resp: 18   Constitutional: Well nourished, well developed female in no acute distress.  HEENT: normal Skin: Warm and dry.  Cardiovascular: Regular rate and rhythm   Extremity: trace to 1+ bilateral pedal edema Respiratory: Clear to auscultation bilaterally. Normal respiratory effort Abdomen: gravid, moderate Neuro: Cranial nerves grossly intact Psych: Alert and Oriented x3. No memory deficits. Normal mood and affect.  MS: normal gait, normal bilateral lower extremity ROM/strength/stability Cervix: 4/60/-2 (RN exam) Uterus: Spontaneous uterine activity  Adnexa: not evaluated  EFM: FHR: 135 bpm, variability: moderate,  accelerations:  Present,  decelerations:  Absent Toco: Frequency: Every 1.5-2 minutes, Duration: 50-80 seconds and Intensity: moderate   Perinatal info:  Blood type: O negative Rubella - Immune Varicella - Immune  TDaP - has not received this pregnancy RPR NR / HIV Neg/ HBsAg Neg   Assessment & Plan:   27 y.o. [id number] @ 7972w2d, Admitted on 10/12/2017 with contractions and possible SROM.  Admit for labor, Observe for cervical change, Fetal Wellbeing Reassuring, Epidural when ready, AROM when Appropriate and GBS status negative, treat as needed  Marcelyn BruinsJacelyn Schmid, CNM [facility], [facility] 10/12/2017  10:28 PM

## 2017-10-13 ENCOUNTER — Inpatient Hospital Stay: Payer: Medicaid Other | Admitting: Anesthesiology

## 2017-10-13 DIAGNOSIS — Z3A39 39 weeks gestation of pregnancy: Secondary | ICD-10-CM

## 2017-10-13 DIAGNOSIS — F1290 Cannabis use, unspecified, uncomplicated: Secondary | ICD-10-CM

## 2017-10-13 DIAGNOSIS — O99324 Drug use complicating childbirth: Secondary | ICD-10-CM

## 2017-10-13 MED ORDER — OXYCODONE HCL 5 MG PO TABS
5.0000 mg | ORAL_TABLET | ORAL | Status: DC | PRN
  Administered 2017-10-14: 5 mg via ORAL
  Filled 2017-10-13 (×2): qty 1

## 2017-10-13 MED ORDER — TERBUTALINE SULFATE 1 MG/ML IJ SOLN: 0.2500 mg | Freq: Once | INTRAMUSCULAR | Status: DC | PRN

## 2017-10-13 MED ORDER — EPHEDRINE 5 MG/ML INJ
10.0000 mg | INTRAVENOUS | Status: DC | PRN
  Filled 2017-10-13: qty 2

## 2017-10-13 MED ORDER — FERROUS SULFATE 325 (65 FE) MG PO TABS
325.0000 mg | ORAL_TABLET | Freq: Every day | ORAL | Status: DC
  Administered 2017-10-14: 325 mg via ORAL
  Filled 2017-10-13: qty 1

## 2017-10-13 MED ORDER — PRENATAL MULTIVITAMIN CH
1.0000 | ORAL_TABLET | Freq: Every day | ORAL | Status: DC
  Administered 2017-10-14: 1 via ORAL
  Filled 2017-10-13: qty 1

## 2017-10-13 MED ORDER — OXYTOCIN 40 UNITS IN LACTATED RINGERS INFUSION - SIMPLE MED
1.0000 m[IU]/min | INTRAVENOUS | Status: DC
  Administered 2017-10-13: 2 m[IU]/min via INTRAVENOUS

## 2017-10-13 MED ORDER — PHENYLEPHRINE 40 MCG/ML (10ML) SYRINGE FOR IV PUSH (FOR BLOOD PRESSURE SUPPORT)
80.0000 ug | PREFILLED_SYRINGE | INTRAVENOUS | Status: DC | PRN
  Filled 2017-10-13: qty 5

## 2017-10-13 MED ORDER — BENZOCAINE-MENTHOL 20-0.5 % EX AERO: 1.0000 "application " | INHALATION_SPRAY | CUTANEOUS | Status: DC | PRN

## 2017-10-13 MED ORDER — PROMETHAZINE HCL 25 MG/ML IJ SOLN
12.5000 mg | Freq: Four times a day (QID) | INTRAMUSCULAR | Status: DC | PRN
  Administered 2017-10-13: 12.5 mg via INTRAVENOUS
  Filled 2017-10-13: qty 1

## 2017-10-13 MED ORDER — DIBUCAINE 1 % RE OINT: 1.0000 "application " | TOPICAL_OINTMENT | RECTAL | Status: DC | PRN

## 2017-10-13 MED ORDER — IBUPROFEN 600 MG PO TABS
600.0000 mg | ORAL_TABLET | Freq: Four times a day (QID) | ORAL | Status: DC
  Administered 2017-10-13 – 2017-10-14 (×5): 600 mg via ORAL
  Filled 2017-10-13 (×5): qty 1

## 2017-10-13 MED ORDER — FENTANYL 2.5 MCG/ML W/ROPIVACAINE 0.15% IN NS 100 ML EPIDURAL (ARMC)
12.0000 mL/h | EPIDURAL | Status: DC
  Administered 2017-10-13: 12 mL/h via EPIDURAL
  Filled 2017-10-13: qty 100

## 2017-10-13 MED ORDER — ONDANSETRON HCL 4 MG PO TABS: 4.0000 mg | ORAL_TABLET | ORAL | Status: DC | PRN

## 2017-10-13 MED ORDER — FENTANYL 2.5 MCG/ML W/ROPIVACAINE 0.15% IN NS 100 ML EPIDURAL (ARMC)
EPIDURAL | Status: DC | PRN
  Administered 2017-10-13: 12 mL/h via EPIDURAL

## 2017-10-13 MED ORDER — LIDOCAINE HCL (PF) 1 % IJ SOLN
INTRAMUSCULAR | Status: DC | PRN
  Administered 2017-10-13: 1.2 mL via SUBCUTANEOUS

## 2017-10-13 MED ORDER — LACTATED RINGERS IV SOLN: 500.0000 mL | Freq: Once | INTRAVENOUS | Status: DC

## 2017-10-13 MED ORDER — ONDANSETRON HCL 4 MG/2ML IJ SOLN: 4.0000 mg | INTRAMUSCULAR | Status: DC | PRN

## 2017-10-13 MED ORDER — DIPHENHYDRAMINE HCL 50 MG/ML IJ SOLN: 12.5000 mg | INTRAMUSCULAR | Status: DC | PRN

## 2017-10-13 MED ORDER — SODIUM CHLORIDE FLUSH 0.9 % IV SOLN
INTRAVENOUS | Status: AC
  Filled 2017-10-13: qty 10

## 2017-10-13 MED ORDER — LIDOCAINE-EPINEPHRINE (PF) 1.5 %-1:200000 IJ SOLN
INTRAMUSCULAR | Status: DC | PRN
  Administered 2017-10-13: 3 mL via EPIDURAL

## 2017-10-13 MED ORDER — SIMETHICONE 80 MG PO CHEW: 80.0000 mg | CHEWABLE_TABLET | ORAL | Status: DC | PRN

## 2017-10-13 MED ORDER — COCONUT OIL OIL: 1.0000 "application " | TOPICAL_OIL | Status: DC | PRN

## 2017-10-13 MED ORDER — WITCH HAZEL-GLYCERIN EX PADS: 1.0000 "application " | MEDICATED_PAD | CUTANEOUS | Status: DC | PRN

## 2017-10-13 MED ORDER — TETANUS-DIPHTH-ACELL PERTUSSIS 5-2.5-18.5 LF-MCG/0.5 IM SUSP
0.5000 mL | Freq: Once | INTRAMUSCULAR | Status: AC
  Administered 2017-10-14: 0.5 mL via INTRAMUSCULAR
  Filled 2017-10-13: qty 0.5

## 2017-10-13 MED ORDER — SENNOSIDES-DOCUSATE SODIUM 8.6-50 MG PO TABS
2.0000 | ORAL_TABLET | ORAL | Status: DC
  Administered 2017-10-14: 2 via ORAL
  Filled 2017-10-13: qty 2

## 2017-10-13 NOTE — Plan of Care (Signed)
Reviewed plan of care with pt. All questions answered. Will monitor closely. 

## 2017-10-13 NOTE — Progress Notes (Signed)
  Labor Progress Note   27 y.o. [id number] @ 7019w3d , admitted for  Pregnancy, Labor Management.   Subjective:  Resting comfortably with epidural. Nausea improved with promethazine.  Objective:  BP (!) 90/57   Pulse (!) 110   Temp (!) 97 F (36.1 C) (Axillary)   Resp 16   Ht 4\' 9"  (1.448 m)   Wt 139 lb (63 kg)   LMP 02/06/2017   SpO2 99%   BMI 30.08 kg/m  Abd: moderate Extr: trace to 1+ bilateral pedal edema SVE: 5.5/80/-2  EFM: FHR: 135 bpm, variability: moderate,  accelerations:  Present,  decelerations:  Present occasional variable Toco: Frequency: Every 3-5 minutes, Duration: 80-100 seconds and Intensity: moderate Labs: I have reviewed the patient's lab results.  Assessment & Plan:  [id number] @ 7219w3d, admitted for  Pregnancy and Labor/Delivery Management  1. Pain management: epidural. 2. FWB: FHT category II.  3. ID: GBS negative 4. Labor management: observe for cervical change, consider Pitocin augmentation as needed.  Marcelyn BruinsJacelyn Estephan Gallardo, CNM 10/13/2017  4:40 AM

## 2017-10-13 NOTE — Anesthesia Procedure Notes (Signed)
Epidural Patient location during procedure: OB Start time: 10/13/2017 12:04 AM End time: 10/13/2017 12:25 AM  Staffing Performed: anesthesiologist   Preanesthetic Checklist Completed: patient identified, site marked, surgical consent, pre-op evaluation, timeout performed, IV checked, risks and benefits discussed and monitors and equipment checked  Epidural Patient position: sitting Prep: ChloraPrep Patient monitoring: heart rate, continuous pulse ox and blood pressure Approach: midline Location: L4-L5 Injection technique: LOR saline  Needle:  Needle type: Tuohy  Needle gauge: 17 G Needle length: 9 cm and 9 Needle insertion depth: 6 cm Catheter type: closed end flexible Catheter size: 20 Guage Catheter at skin depth: 8 cm Test dose: negative and 1.5% lidocaine with Epi 1:200 K  Assessment Events: blood not aspirated, injection not painful, no injection resistance, negative IV test and no paresthesia  Additional Notes   Patient tolerated the insertion well without complications.Reason for block:procedure for pain

## 2017-10-13 NOTE — Discharge Summary (Signed)
Physician Obstetric Discharge Summary  Patient ID: Mary [name] MRN: [id number] DOB/AGE: 25/11/1990 26 y.o.   Date of Admission: 10/12/2017 Date of Delivery: 10/13/2017 Date of Discharge: 10/14/2017  Admitting Diagnosis: Onset of Labor at 10321w3d  Secondary Diagnosis: RH negative status  Mode of Delivery: normal spontaneous vaginal delivery 10/13/2017      Discharge Diagnosis: Term intrauterine pregnancy delivered   Intrapartum Procedures: Atificial rupture of membranes, epidural and placement of intrauterine catheter   Post partum procedures: none Postpartum Rhogam not indicated as baby is also Rh negative  Complications: none   Brief Hospital Course  Mary [name] is a [id number] who had a SVD on 10/13/2017;  for further details of this delivery, please refer to the delivery note.  Patient had an uncomplicated postpartum course.  By time of discharge on PPD#1, her pain was controlled on oral pain medications; she had appropriate lochia and was ambulating, voiding without difficulty and tolerating regular diet.  She was deemed stable for discharge to home.    Labs: CBC Latest Ref Rng & Units 10/14/2017 10/12/2017 07/30/2017  WBC 3.6 - 11.0 K/uL 9.1 12.1(H) 7.0  Hemoglobin 12.0 - 16.0 g/dL 11.0(L) 12.1 11.0(L)  Hematocrit 35.0 - 47.0 % 32.3(L) 35.2 32.1(L)  Platelets 150 - 440 K/uL 121(L) 157 163   O NEG/ RI/ VI  Physical exam:  Blood pressure 109/70, pulse 63, temperature 97.9 F (36.6 C), temperature source Oral, resp. rate 18, height 4\' 9"  (1.448 m), weight 139 lb (63 kg), last menstrual period 02/06/2017, SpO2 99 %, currently breastfeeding. General: alert and no distress Lochia: appropriate Abdomen: soft, NT Uterine Fundus: firm Incision: NA Extremities: No evidence of DVT seen on physical exam. No lower extremity edema.  Discharge Instructions: Per After Visit Summary. Activity: Advance as tolerated. Pelvic rest for 6 weeks.  Also refer to Discharge  Instructions Diet: Regular Medications: Allergies as of 10/14/2017   No Known Allergies     Medication List    STOP taking these medications   Doxylamine-Pyridoxine 10-10 MG Tbec Commonly known as:  DICLEGIS     TAKE these medications   PRENATAL 28-0.8 MG Tabs Take by mouth.      Outpatient follow up:  Follow-up Information    Nadara MustardHarris, Robert P, MD. Schedule an appointment as soon as possible for a visit in 6 week(s).   Specialty:  Obstetrics and Gynecology Why:  Please call to schedule your 6 week postpartum follow up appointment with Dr. Lalla BrothersHarris Contact information: [address] [place] [address] [phone]          Postpartum contraception: Depo-Provera  Discharged Condition: good  Discharged to: home   Newborn Data: BAby boy/ Disposition:home with mother  Apgars: APGAR (1 MIN):   APGAR (5 MINS):   APGAR (10 MINS):    Baby Feeding: Breast  Mary [name], CNM 10/14/2017 1:13 PM

## 2017-10-13 NOTE — Progress Notes (Signed)
L&D Progress Note 27 year old G5 P4014 admitted in labor and received epidural after which contractions spaced out and there has been no further dilation since admission. Started on Pitocin augmentation this Am and is now on 6 miu/min PItocin. Had some variable decelerations during the night.   S: Comfortable after epidural  O: BP (!) 100/55   Pulse 72   Temp 97.9 F (36.6 C) (Oral)   Resp 16   Ht 4\' 9"  (1.448 m)   Wt 63 kg (139 lb)   LMP 02/06/2017   SpO2 97%   BMI 30.08 kg/m    General: Hispanic gravid female in NAD  FHR: 150 baseline with accelerations to 170s and moderate variability, with occasional mild  variable decelerations with contractions Toco: Contractions every 2-3 minutes  Cervix: 5/80-90%/-1  A: No cervical progress FWB: Cat 1 at this time   P: AROM-clear fluid IUPC placed Continue Pitocin/ Position changes If variable decelerations become persistent, consider amnioinfusion Monitor fetal well being and cervical progress.  Mary [name], CNM

## 2017-10-13 NOTE — Lactation Note (Signed)
This note was copied from a baby's chart. Lactation Consultation Note  Patient Name: Boy Shalicia Venda RodesMontanez Vargas ZOXWR'UToday's Date: 10/13/2017 Reason for consult: Initial assessment   Maternal Data    Feeding Feeding Type: Breast Fed Length of feed: 30 min  LATCH Score                   Interventions Interventions: Breast feeding basics reviewed  Lactation Tools Discussed/Used     Consult Status  Mother states that infant is breastfeeding well and has breast fed previous children. Breastfeeding basics reviewed. Mother denies any concerns at this time.    Arlyss Gandylicia Rohin Krejci 10/13/2017, 5:02 PM

## 2017-10-13 NOTE — Anesthesia Preprocedure Evaluation (Signed)
Anesthesia Evaluation  Patient identified by MRN, date of birth, ID band Patient awake    Reviewed: Allergy & Precautions, NPO status , Patient's Chart, lab work & pertinent test results  History of Anesthesia Complications Negative for: history of anesthetic complications  Airway Mallampati: II       Dental   Pulmonary neg sleep apnea, neg COPD, former smoker,           Cardiovascular (-) hypertension(-) Past MI and (-) CHF (-) dysrhythmias (-) Valvular Problems/Murmurs     Neuro/Psych neg Seizures    GI/Hepatic Neg liver ROS, GERD (only with pregnancy)  ,(+)     substance abuse  marijuana use,   Endo/Other  neg diabetes  Renal/GU negative Renal ROS     Musculoskeletal   Abdominal   Peds  Hematology  (+) anemia ,   Anesthesia Other Findings   Reproductive/Obstetrics (+) Pregnancy                             Anesthesia Physical Anesthesia Plan  ASA: II  Anesthesia Plan: Epidural   Post-op Pain Management:    Induction:   PONV Risk Score and Plan:   Airway Management Planned:   Additional Equipment:   Intra-op Plan:   Post-operative Plan:   Informed Consent: I have reviewed the patients History and Physical, chart, labs and discussed the procedure including the risks, benefits and alternatives for the proposed anesthesia with the patient or authorized representative who has indicated his/her understanding and acceptance.     Plan Discussed with:   Anesthesia Plan Comments:         Anesthesia Quick Evaluation

## 2017-10-14 ENCOUNTER — Encounter: Payer: Medicaid Other | Admitting: Obstetrics and Gynecology

## 2017-10-14 LAB — TYPE AND SCREEN
ABO/RH(D): O NEG
Antibody Screen: POSITIVE
UNIT DIVISION: 0
Unit division: 0

## 2017-10-14 LAB — BPAM RBC
Blood Product Expiration Date: 201907292359
Blood Product Expiration Date: 201907312359
UNIT TYPE AND RH: 9500
Unit Type and Rh: 9500

## 2017-10-14 LAB — CBC
HCT: 32.3 % — ABNORMAL LOW (ref 35.0–47.0)
HEMOGLOBIN: 11 g/dL — AB (ref 12.0–16.0)
MCH: 30.2 pg (ref 26.0–34.0)
MCHC: 34.1 g/dL (ref 32.0–36.0)
MCV: 88.7 fL (ref 80.0–100.0)
PLATELETS: 121 10*3/uL — AB (ref 150–440)
RBC: 3.64 MIL/uL — AB (ref 3.80–5.20)
RDW: 12.2 % (ref 11.5–14.5)
WBC: 9.1 10*3/uL (ref 3.6–11.0)

## 2017-10-14 LAB — RPR: RPR Ser Ql: NONREACTIVE

## 2017-10-14 NOTE — Discharge Instructions (Signed)
Please call your doctor or return to the ER if you experience any chest pains, shortness of breath, dizziness, visual changes, fever greater than 101, any heavy bleeding (saturating more than 1 pad per hour), large clots, or foul smelling discharge, any worsening abdominal pain and cramping that is not controlled by pain medication, or any signs of postpartum depression. No tampons, enemas, douches, or sexual intercourse for 6 weeks. Also avoid tub baths, hot tubs, or swimming for 6 weeks.  °

## 2017-10-14 NOTE — Progress Notes (Signed)
Post Partum Day 1 Subjective: Doing well, no complaints.  Tolerating regular diet, pain with PO meds, voiding and ambulating without difficulty.  No CP SOB F/C N/V or leg pain No HA, change of vision, RUQ/epigastric pain  Objective: BP 109/70   Pulse 63   Temp 97.9 F (36.6 C) (Oral)   Resp 18   Ht 4\' 9"  (1.448 m)   Wt 139 lb (63 kg)   LMP 02/06/2017   SpO2 99%   Breastfeeding  BMI 30.08 kg/m    Physical Exam:  General: NAD CV: RRR Pulm: nl effort, CTABL Lochia: moderate Uterine Fundus: fundus firm and below umbilicus DVT Evaluation: no cords, ttp LEs   Recent Labs    10/12/17 2211 10/14/17 0551  HGB 12.1 11.0*  HCT 35.2 32.3*  WBC 12.1* 9.1  PLT 157 121*    Assessment/Plan: 26 y.o. [id number] postpartum day # 1  1. Continue routine postpartum care 2. O negative- Rhogam not indicated, baby is also O negative 3. Rubella Immune, Varicella Immune 4. TDAP needs postpartum 5. Breastfeeding- recommend donor milk/pump and dump due to marijuana use 6. Contraception: Depo- first dose prior to discharge 7. Social work consult due to newborn with MJ positive UDS 8. Disposition pending social work/pediatrics   Tresea MallJane Darlene Brozowski, CNM  [facility], [facility]

## 2017-10-14 NOTE — Clinical Social Work Note (Signed)
The following is the CSW documentation from patient's infant's MEDICAL RECORD NUMBER CLINICAL SOCIAL WORK MATERNAL/CHILD NOTE  Patient Details  Name: Mary [name] MRN: [id number] Date of Birth: 10/13/2015  Date:  10/14/2017  Clinical Social Worker Initiating Note:  York SpanielMonica Demaurion Dicioccio MSW,LcSW          Date/Time: Initiated:  10/14/17/                 Child's Name:      Biological Parents:  Mother, Father   Need for Interpreter:  None   Reason for Referral:  Current Substance Use/Substance Use During Pregnancy    Address:  [address] [place] [address] [place] [address]    Phone number:  [phone] (home)     Additional phone number: none  Household Members/Support Persons (HM/SP):       HM/SP Name Relationship DOB or Age  HM/SP -1     HM/SP -2     HM/SP -3     HM/SP -4     HM/SP -5     HM/SP -6     HM/SP -7     HM/SP -8       Natural Supports (not living in the home): Immediate Family   Professional Supports:None   Employment:    Type of Work:     Education:      Homebound arranged:    Architectinancial Resources:    Other Resources:     Cultural/Religious Considerations Which May Impact Care: none  Strengths: Ability to meet basic needs , Home prepared for child    Psychotropic Medications:         Pediatrician:       [place] List:   [facility]   [place]   [place]   [place]   [place]   [place]     Pediatrician Fax Number:    Risk Factors/Current Problems: Substance Use    Cognitive State: Alert , Able to Concentrate    Mood/Affect: Calm    CSW Assessment:Assessment conducted with patient's mother and father. Patient's parents are married and have four other children together in the home. They have all necessities for their newborn. They have supportive family in the area. Patient's mother denies any history of mental illness. CSW provided education  regarding postpartum depression. Patient's mother admits to using marijuana throughout her pregnancy for nausea. She states that she did not use around or while caring for the children. She states she has no intention of continuing to use. CSW provided education surrounding the need to make a DSS Child Protective Service Report. Patient's parents verbalized understanding.   CSW made DSS Child Protective Report.  CSW Plan/Description: Child Protective Service Report     York SpanielMonica Nefi Musich, LCSW 10/14/2017, 2:43 PM

## 2017-10-14 NOTE — Progress Notes (Signed)
Patient discharged home with infant. Discharge instructions and prescriptions given and reviewed with patient. Patient verbalized understanding. Escorted out by auxillary.  

## 2017-10-14 NOTE — Anesthesia Postprocedure Evaluation (Signed)
Anesthesia Post Note  Patient: Mary [name]  Procedure(s) Performed: AN AD HOC LABOR EPIDURAL  Patient location during evaluation: Mother Baby Anesthesia Type: Epidural Level of consciousness: awake, awake and alert and oriented Pain management: pain level controlled Vital Signs Assessment: post-procedure vital signs reviewed and stable Respiratory status: spontaneous breathing Cardiovascular status: blood pressure returned to baseline Postop Assessment: no headache and no backache Anesthetic complications: no     Last Vitals:  Vitals:   10/13/17 1913 10/13/17 2322  BP: 114/72 109/69  Pulse: (!) 59 (!) 59  Resp: 18 18  Temp: 36.7 C 36.7 C  SpO2:  100%    Last Pain:  Vitals:   10/14/17 0348  TempSrc:   PainSc: 9                  Jeanny Rymer

## 2017-10-14 NOTE — Lactation Note (Addendum)
This note was copied from a baby's chart. Lactation Consultation Note  Patient Name: Mary [name] Today's Date: 10/14/2017     Maternal Data    Feeding Feeding Type: Breast Fed Length of feed: 6 min(per mom)  LATCH Score                   Interventions    Lactation Tools Discussed/Used     Consult Status  Spoke with mom about the risks of breastfeeding baby and using marijuana. Mom understands that the brain grows the fastest in the first year of life and since research is still ongoing about outcomes of breastfed children with lactating parents who are positive for MJ, baby could possibly acquire developmental delays and is more prone to ADHD,etc. Mom used MJ with last baby who is now 2yo and LC spoke with her about her usage then as well. She states that the child is more hyper than her older two children were at this age. LC correlated the behavior to the use of MJ as a possibility so mom could have a better understanding. LC encouraged mom to not use MJ during her nursing journey this time. LC also left parents with ABM Protocol #21.    Mary [name] 10/14/2017, 10:30 AM

## 2017-11-30 ENCOUNTER — Ambulatory Visit: Payer: Medicaid Other | Admitting: Certified Nurse Midwife

## 2019-11-07 DIAGNOSIS — L309 Dermatitis, unspecified: Secondary | ICD-10-CM | POA: Diagnosis not present

## 2019-11-07 DIAGNOSIS — N390 Urinary tract infection, site not specified: Secondary | ICD-10-CM | POA: Diagnosis not present

## 2019-11-07 DIAGNOSIS — R8290 Unspecified abnormal findings in urine: Secondary | ICD-10-CM | POA: Diagnosis not present

## 2019-12-30 DIAGNOSIS — R200 Anesthesia of skin: Secondary | ICD-10-CM | POA: Diagnosis not present

## 2019-12-30 DIAGNOSIS — F419 Anxiety disorder, unspecified: Secondary | ICD-10-CM | POA: Diagnosis not present

## 2019-12-30 DIAGNOSIS — R21 Rash and other nonspecific skin eruption: Secondary | ICD-10-CM | POA: Diagnosis not present

## 2020-03-28 DIAGNOSIS — L309 Dermatitis, unspecified: Secondary | ICD-10-CM | POA: Diagnosis not present

## 2020-03-28 DIAGNOSIS — Z3490 Encounter for supervision of normal pregnancy, unspecified, unspecified trimester: Secondary | ICD-10-CM | POA: Diagnosis not present

## 2020-03-28 DIAGNOSIS — R21 Rash and other nonspecific skin eruption: Secondary | ICD-10-CM | POA: Diagnosis not present

## 2021-04-10 ENCOUNTER — Ambulatory Visit (LOCAL_COMMUNITY_HEALTH_CENTER): Payer: Medicaid Other

## 2021-04-10 ENCOUNTER — Other Ambulatory Visit: Payer: Self-pay

## 2021-04-10 VITALS — BP 102/66 | Ht <= 58 in | Wt 120.5 lb

## 2021-04-10 DIAGNOSIS — Z3201 Encounter for pregnancy test, result positive: Secondary | ICD-10-CM

## 2021-04-10 LAB — PREGNANCY, URINE: Preg Test, Ur: POSITIVE — AB

## 2021-04-10 MED ORDER — PRENATAL 27-0.8 MG PO TABS: 1.0000 | ORAL_TABLET | Freq: Every day | ORAL | 0 refills | Status: AC

## 2021-04-10 NOTE — Progress Notes (Signed)
UPT positive. Plans prenatal care at [facility].To DSS for Medicaid/Preg women. Jerel Shepherd, RN

## 2021-04-14 NOTE — L&D Delivery Note (Signed)
       Delivery Note   Mary [name] is a 31 y.o. [id number] at [redacted]w[redacted]d Estimated Date of Delivery: 09/27/21  PRE-OPERATIVE DIAGNOSIS:  1) [redacted]w[redacted]d pregnancy.   POST-OPERATIVE DIAGNOSIS:  1) [redacted]w[redacted]d pregnancy s/p Vaginal, Spontaneous   Delivery Type: Vaginal, Spontaneous    Delivery Anesthesia: Epidural   Labor Complications:  none    ESTIMATED BLOOD LOSS: 100  ml    FINDINGS:   1) female infant, Apgar scores of 9   at 1 minute and 9   at 5 minutes and a birthweight of 106.17  ounces.    2) Nuchal cord: no  SPECIMENS:   PLACENTA:   Appearance: Intact , 3 vessel cord, cord blood sample collected   Removal: Spontaneous      Disposition:  per protocol   DISPOSITION:  Infant to left in stable condition in the delivery room, with L&D personnel and mother,  NARRATIVE SUMMARY: Labor course:  [name] is a [id number] at [redacted]w[redacted]d who presented for labor management.  She progressed well in labor with pitocin.  She received the appropriate anesthesia and proceeded to complete dilation. She evidenced good maternal expulsive effort during the second stage. She went on to deliver a viable female infant. The placenta delivered without problems and was noted to be complete. A perineal and vaginal examination was performed. Episiotomy/Lacerations: None  The patient tolerated this well.  Philip Aspen, CNM  09/23/2021 12:20 PM

## 2021-04-26 ENCOUNTER — Ambulatory Visit (INDEPENDENT_AMBULATORY_CARE_PROVIDER_SITE_OTHER): Payer: Medicaid Other | Admitting: Obstetrics

## 2021-04-26 ENCOUNTER — Encounter: Payer: Self-pay | Admitting: Obstetrics

## 2021-04-26 ENCOUNTER — Other Ambulatory Visit: Payer: Self-pay

## 2021-04-26 ENCOUNTER — Other Ambulatory Visit (HOSPITAL_COMMUNITY)
Admission: RE | Admit: 2021-04-26 | Discharge: 2021-04-26 | Disposition: A | Discharge status: Home / Self Care | Payer: Medicaid Other | Source: Ambulatory Visit | Attending: Obstetrics | Admitting: Obstetrics

## 2021-04-26 VITALS — BP 116/78 | HR 96 | Ht <= 58 in | Wt 117.9 lb

## 2021-04-26 DIAGNOSIS — Z124 Encounter for screening for malignant neoplasm of cervix: Secondary | ICD-10-CM

## 2021-04-26 DIAGNOSIS — Z113 Encounter for screening for infections with a predominantly sexual mode of transmission: Secondary | ICD-10-CM | POA: Diagnosis not present

## 2021-04-26 DIAGNOSIS — Z3A18 18 weeks gestation of pregnancy: Secondary | ICD-10-CM

## 2021-04-26 DIAGNOSIS — Z1379 Encounter for other screening for genetic and chromosomal anomalies: Secondary | ICD-10-CM

## 2021-04-26 DIAGNOSIS — Z0283 Encounter for blood-alcohol and blood-drug test: Secondary | ICD-10-CM | POA: Diagnosis not present

## 2021-04-26 DIAGNOSIS — Z3482 Encounter for supervision of other normal pregnancy, second trimester: Secondary | ICD-10-CM

## 2021-04-26 NOTE — Progress Notes (Signed)
NEW OB HISTORY AND PHYSICAL  SUBJECTIVE:       Mary [name] is a 31 y.o. [id number] female, Patient's last menstrual period was 12/21/2020 (exact date)., Estimated Date of Delivery: 09/27/21, [redacted]w[redacted]d, presents today for establishment of Prenatal Care. She reports poor appetite, nausea, and vomiting up stomach bile. Encouraged frequent small meals and protein shakes. She is taking a prenatal vitamin.   Social history Partner/Relationship: Married. Partner Meta Hatchet Living situation: Lives with husband and 5 children. Work: [employer] Exercise: Squats, lifting weights Substance use: Vapes THC   Gynecologic History Patient's last menstrual period was 12/21/2020 (exact date).  Contraception: none Last Pap: 05/14/17. Normal.  Obstetric History OB History  Gravida Para Term Preterm AB Living  7 5 5  0 1 5  SAB IAB Ectopic Multiple Live Births  1 0 0 0 5    # Outcome Date GA Lbr Len/2nd Weight Sex Delivery Anes PTL Lv  7 Current           6 Term 10/13/17 [redacted]w[redacted]d 08:18 / 00:12 7 lb 0.2 oz (3.18 kg) M Vag-Spont EPI  LIV  5 Term 05/16/15 [redacted]w[redacted]d 03:15 / 00:27 7 lb 12.2 oz (3.52 kg) M Vag-Spont EPI  LIV  4 Term 11/17/13 [redacted]w[redacted]d  8 lb 7 oz (3.827 kg) M Vag-Spont   LIV  3 Term 07/24/11 [redacted]w[redacted]d  8 lb 2 oz (3.685 kg) F Vag-Spont   LIV  2 Term 07/16/09 [redacted]w[redacted]d  7 lb 2 oz (3.232 kg) F Vag-Spont   LIV  1 SAB 2009 [redacted]w[redacted]d         /  Past Medical History:  Diagnosis Date   Anemia affecting pregnancy    Gallstones    GERD (gastroesophageal reflux disease)     Past Surgical History:  Procedure Laterality Date   APPENDECTOMY     LAPAROSCOPIC CHOLECYSTECTOMY  10/24/2014   [redacted] wks pregnant    Current Outpatient Medications on File Prior to Visit  Medication Sig Dispense Refill   PRENATAL 28-0.8 MG TABS Take by mouth.     Prenatal Vit-Fe Fumarate-FA (MULTIVITAMIN-PRENATAL) 27-0.8 MG TABS tablet Take 1 tablet by mouth daily at 12 noon. 100 tablet 0   No current facility-administered medications on file  prior to visit.    No Known Allergies  Social History   Socioeconomic History   Marital status: Married    Spouse name: Shellia Carwin   Number of children: 3   Years of education: 8   Highest education level: Not on file  Occupational History   Occupation: House wife  Tobacco Use   Smoking status: Former    Types: Cigarettes    Quit date: 04/05/2017    Years since quitting: 4.0   Smokeless tobacco: Never  Vaping Use   Vaping Use: Every day   Substances: THC   Devices: per pt, vaping THC to help with nausea while preg  Substance and Sexual Activity   Alcohol use: No   Drug use: Yes    Types: Marijuana    Comment: Reports that she uses marijuana only for nausea in pregnancy   Sexual activity: Yes    Birth control/protection: None  Other Topics Concern   Not on file  Social History Narrative   Not on file   Social Determinants of Health   Financial Resource Strain: Not on file  Food Insecurity: Not on file  Transportation Needs: Not on file  Physical Activity: Not on file  Stress: Not on file  Social Connections: Not on file  Intimate Partner Violence: Not At Risk   Fear of Current or Ex-Partner: No   Emotionally Abused: No   Physically Abused: No   Sexually Abused: No    Family History  Problem Relation Age of Onset   Hypertension Mother    Diabetes Mother    Lupus Mother    Hypertension Father     The following portions of the patient's history were reviewed and updated as appropriate: allergies, current medications, past OB history, past medical history, past surgical history, past family history, past social history, and problem list.  History obtained from the patient General ROS: negative for - chills, fatigue, or sleep disturbance Psychological ROS: negative for - anxiety or depression ENT ROS: negative for - headaches, sinus pain, or visual changes Endocrine ROS: positive for - excessive sweating negative for - breast changes, galactorrhea, or  unexpected weight changes Breast ROS: negative for breast lumps Respiratory ROS: no cough, shortness of breath, or wheezing Cardiovascular ROS: no chest pain or dyspnea on exertion Gastrointestinal ROS: positive for - nausea/vomiting negative for - constipation or diarrhea Genito-Urinary ROS: no dysuria, trouble voiding, or hematuria Positive for odor to urine, yellowish vaginal discharge Musculoskeletal ROS: negative for - gait disturbance, joint pain, or muscle pain   OBJECTIVE: Initial Physical Exam (New OB)  GENERAL APPEARANCE: alert, well appearing HEAD: normocephalic, atraumatic MOUTH: mucous membranes moist, pharynx normal without lesions THYROID: no thyromegaly or masses present BREASTS: no masses noted, no significant tenderness, no palpable axillary nodes, no skin changes LUNGS: clear to auscultation, no wheezes, rales or rhonchi, symmetric air entry HEART: regular rate and rhythm, no murmurs ABDOMEN: soft, nontender, nondistended, no abnormal masses, no epigastric pain, FHT present, and 157 EXTREMITIES: no redness or tenderness in the calves or thighs SKIN: normal coloration and turgor, no rashes, perspiration noted LYMPH NODES: no adenopathy palpable NEUROLOGIC: alert, oriented, normal speech, no focal findings or movement disorder noted PELVIC EXAM: EXTERNAL GENITALIA: normal appearing vulva with no masses, tenderness or lesions VAGINA: discharge copious yellowish-green discharge with mild odor CERVIX: no lesions or cervical motion tenderness UTERUS: gravid   ASSESSMENT: Normal pregnancy at [redacted] weeks gestation Pap due Vaginal discharge   PLAN: Routine prenatal care. We discussed an overview of prenatal care and when to call. Reviewed midwifery versus MD care available; Nickole would like to see midwives in her pregnancy. Reviewed diet, exercise, and weight gain recommendations in pregnancy. Counseled to stop using THC. Discussed benefits of breastfeeding.  Discussed NOB labs and genetic testing, and she would like to proceed with all testing and MaterniT. Pap and swab sent. Nurse education visit completed today. Return in 2 weeks for anatomy US and 4 weeks for [name].  See orders  Lloyd Huger, CNM

## 2021-04-27 ENCOUNTER — Telehealth: Payer: Self-pay | Admitting: Obstetrics

## 2021-04-27 ENCOUNTER — Other Ambulatory Visit: Payer: Self-pay | Admitting: Obstetrics

## 2021-04-27 LAB — URINALYSIS, ROUTINE W REFLEX MICROSCOPIC
Bilirubin, UA: NEGATIVE
Glucose, UA: NEGATIVE
Nitrite, UA: POSITIVE — AB
Specific Gravity, UA: >=1.03 — AB (ref 1.005–1.030)
Urobilinogen, Ur: 1 mg/dL (ref 0.2–1.0)
pH, UA: 6.5 (ref 5.0–7.5)

## 2021-04-27 LAB — MICROSCOPIC EXAMINATION: Casts: NONE SEEN /lpf

## 2021-04-27 MED ORDER — NITROFURANTOIN MONOHYD MACRO 100 MG PO CAPS: 100.0000 mg | ORAL_CAPSULE | Freq: Two times a day (BID) | ORAL | 0 refills | Status: DC

## 2021-04-27 NOTE — Telephone Encounter (Signed)
+  UTI at NOB. Sent in rx for abx. Left message with patient.

## 2021-04-28 LAB — GC/CHLAMYDIA PROBE AMP
Chlamydia trachomatis, NAA: NEGATIVE
Neisseria Gonorrhoeae by PCR: NEGATIVE

## 2021-04-28 LAB — SPECIMEN STATUS REPORT

## 2021-04-28 LAB — TSH: TSH: 0.931 u[IU]/mL (ref 0.450–4.500)

## 2021-04-29 LAB — CULTURE, OB URINE

## 2021-04-29 LAB — URINE CULTURE, OB REFLEX

## 2021-05-01 LAB — CBC
Hematocrit: 35.2 % (ref 34.0–46.6)
Hemoglobin: 12 g/dL (ref 11.1–15.9)
MCH: 30.4 pg (ref 26.6–33.0)
MCHC: 34.1 g/dL (ref 31.5–35.7)
MCV: 89 fL (ref 79–97)
Platelets: 202 10*3/uL (ref 150–450)
RBC: 3.95 x10E6/uL (ref 3.77–5.28)
RDW: 12.3 % (ref 11.7–15.4)
WBC: 10.8 10*3/uL (ref 3.4–10.8)

## 2021-05-01 LAB — RPR: RPR Ser Ql: NONREACTIVE

## 2021-05-01 LAB — TOXOPLASMA ANTIBODIES- IGG AND  IGM
Toxoplasma Antibody- IgM: <3 AU/mL (ref 0.0–7.9)
Toxoplasma IgG Ratio: <3 IU/mL (ref 0.0–7.1)

## 2021-05-01 LAB — ABO AND RH: Rh Factor: NEGATIVE

## 2021-05-01 LAB — VIRAL HEPATITIS HBV, HCV
HCV Ab: <0.1 s/co ratio (ref 0.0–0.9)
Hep B Core Total Ab: NEGATIVE
Hep B Surface Ab, Qual: NONREACTIVE
Hepatitis B Surface Ag: NEGATIVE

## 2021-05-01 LAB — ANTIBODY SCREEN: Antibody Screen: NEGATIVE

## 2021-05-01 LAB — HIV ANTIBODY (ROUTINE TESTING W REFLEX): HIV Screen 4th Generation wRfx: NONREACTIVE

## 2021-05-01 LAB — VARICELLA ZOSTER ANTIBODY, IGG: Varicella zoster IgG: 207 index (ref >165)

## 2021-05-01 LAB — RUBELLA SCREEN: Rubella Antibodies, IGG: 1.61 index (ref >0.99)

## 2021-05-01 LAB — HCV INTERPRETATION

## 2021-05-03 ENCOUNTER — Encounter: Payer: Self-pay | Admitting: Obstetrics

## 2021-05-03 ENCOUNTER — Other Ambulatory Visit: Payer: Self-pay

## 2021-05-03 LAB — MATERNIT21 PLUS CORE+SCA
Fetal Fraction: 7
Monosomy X (Turner Syndrome): NOT DETECTED
Result (T21): NEGATIVE
Trisomy 13 (Patau syndrome): NEGATIVE
Trisomy 18 (Edwards syndrome): NEGATIVE
Trisomy 21 (Down syndrome): NEGATIVE
XXX (Triple X Syndrome): NOT DETECTED
XXY (Klinefelter Syndrome): NOT DETECTED
XYY (Jacobs Syndrome): NOT DETECTED

## 2021-05-06 LAB — CYTOLOGY - PAP: Diagnosis: NEGATIVE

## 2021-05-06 LAB — CERVICOVAGINAL ANCILLARY ONLY
Bacterial Vaginitis (gardnerella): POSITIVE — AB
Candida Glabrata: NEGATIVE
Candida Vaginitis: NEGATIVE
Chlamydia: NEGATIVE
Comment: NEGATIVE
Comment: NEGATIVE
Comment: NEGATIVE
Comment: NEGATIVE
Comment: NEGATIVE
Comment: NORMAL
Neisseria Gonorrhea: NEGATIVE
Trichomonas: NEGATIVE

## 2021-05-07 ENCOUNTER — Encounter: Payer: Self-pay | Admitting: Obstetrics

## 2021-05-09 ENCOUNTER — Encounter: Payer: Self-pay | Admitting: Obstetrics

## 2021-05-09 ENCOUNTER — Other Ambulatory Visit: Payer: Self-pay | Admitting: Obstetrics

## 2021-05-09 LAB — MONITOR DRUG PROFILE 14(MW)
Amphetamine Scrn, Ur: NEGATIVE ng/mL
BARBITURATE SCREEN URINE: NEGATIVE ng/mL
BENZODIAZEPINE SCREEN, URINE: NEGATIVE ng/mL
Buprenorphine, Urine: NEGATIVE ng/mL
Cocaine (Metab) Scrn, Ur: NEGATIVE ng/mL
Creatinine(Crt), U: 283.7 mg/dL (ref 20.0–300.0)
Fentanyl, Urine: NEGATIVE pg/mL
Meperidine Screen, Urine: NEGATIVE ng/mL
Methadone Screen, Urine: NEGATIVE ng/mL
OXYCODONE+OXYMORPHONE UR QL SCN: NEGATIVE ng/mL
Opiate Scrn, Ur: NEGATIVE ng/mL
Ph of Urine: 6.2 (ref 4.5–8.9)
Phencyclidine Qn, Ur: NEGATIVE ng/mL
Propoxyphene Scrn, Ur: NEGATIVE ng/mL
SPECIFIC GRAVITY: 1.024
Tramadol Screen, Urine: NEGATIVE ng/mL

## 2021-05-09 LAB — CANNABINOID (GC/MS), URINE
Cannabinoid: POSITIVE — AB
Carboxy THC (GC/MS): >750 ng/mL

## 2021-05-09 LAB — NICOTINE SCREEN, URINE: Cotinine Ql Scrn, Ur: NEGATIVE ng/mL

## 2021-05-09 MED ORDER — METRONIDAZOLE 500 MG PO TABS: 500.0000 mg | ORAL_TABLET | Freq: Two times a day (BID) | ORAL | 0 refills | Status: DC

## 2021-05-10 ENCOUNTER — Ambulatory Visit: Payer: Medicaid Other

## 2021-05-10 ENCOUNTER — Other Ambulatory Visit: Payer: Self-pay | Admitting: Obstetrics

## 2021-05-10 ENCOUNTER — Encounter: Payer: Self-pay | Admitting: Obstetrics

## 2021-05-10 ENCOUNTER — Other Ambulatory Visit: Payer: Self-pay

## 2021-05-10 DIAGNOSIS — Z3482 Encounter for supervision of other normal pregnancy, second trimester: Secondary | ICD-10-CM

## 2021-05-10 DIAGNOSIS — Z3A18 18 weeks gestation of pregnancy: Secondary | ICD-10-CM

## 2021-05-10 MED ORDER — ONDANSETRON HCL 4 MG PO TABS: 4.0000 mg | ORAL_TABLET | Freq: Three times a day (TID) | ORAL | 0 refills | Status: DC | PRN

## 2021-05-27 ENCOUNTER — Encounter: Payer: Medicaid Other | Admitting: Certified Nurse Midwife

## 2021-05-27 DIAGNOSIS — Z3A22 22 weeks gestation of pregnancy: Secondary | ICD-10-CM

## 2021-05-27 DIAGNOSIS — Z3482 Encounter for supervision of other normal pregnancy, second trimester: Secondary | ICD-10-CM

## 2021-05-28 ENCOUNTER — Encounter: Payer: Medicaid Other | Admitting: Certified Nurse Midwife

## 2021-06-04 ENCOUNTER — Ambulatory Visit (INDEPENDENT_AMBULATORY_CARE_PROVIDER_SITE_OTHER): Payer: Medicaid Other | Admitting: Certified Nurse Midwife

## 2021-06-04 ENCOUNTER — Other Ambulatory Visit: Payer: Self-pay

## 2021-06-04 VITALS — BP 105/68 | HR 79 | Ht <= 58 in | Wt 131.4 lb

## 2021-06-04 DIAGNOSIS — Z3A23 23 weeks gestation of pregnancy: Secondary | ICD-10-CM

## 2021-06-04 DIAGNOSIS — R399 Unspecified symptoms and signs involving the genitourinary system: Secondary | ICD-10-CM

## 2021-06-04 DIAGNOSIS — Z3482 Encounter for supervision of other normal pregnancy, second trimester: Secondary | ICD-10-CM

## 2021-06-04 LAB — POCT URINALYSIS DIPSTICK OB
Bilirubin, UA: NEGATIVE
Blood, UA: NEGATIVE
Glucose, UA: NEGATIVE
Ketones, UA: NEGATIVE
Leukocytes, UA: NEGATIVE
Nitrite, UA: POSITIVE
Spec Grav, UA: 1.01 (ref 1.010–1.025)
Urobilinogen, UA: 0.2 E.U./dL
pH, UA: 7 (ref 5.0–8.0)

## 2021-06-04 MED ORDER — NITROFURANTOIN MONOHYD MACRO 100 MG PO CAPS: 100.0000 mg | ORAL_CAPSULE | Freq: Two times a day (BID) | ORAL | 0 refills | Status: AC

## 2021-06-04 NOTE — Progress Notes (Addendum)
[name] doing well, state she feels good movement. Concerned that her urine has an odor. Dip was negative. Pt has history UTI ant NOB.  Urine dip today positive for Nitrate. Urine culture sent. Will order macrobid today. Discussed possible given a dose of Rocephin if culture comes back that is susceptible.  Also need daily microbid suppression.  Discussed glucose test next visit. Information sheet given. Follow up 4 wk with Missy for [name] & glucose screen.   Philip Aspen, CNM

## 2021-06-04 NOTE — Patient Instructions (Signed)
Round Ligament Pain The round ligaments are a pair of cord-like tissues that help support the uterus. They can become a source of pain during pregnancy as the ligaments soften and stretch as the baby grows. The pain usually begins in the second trimester (13-28 weeks) of pregnancy, and should only last for a few seconds when it occurs. However, the pain can come and go until the baby is delivered. The pain does not cause harm to the baby. Round ligament pain is usually a short, sharp, and pinching pain, but it can also be a dull, lingering, and aching pain. The pain is felt in the lower side of the abdomen or in the groin. It usually starts deep in the groin and moves up to the outside of the hip area. The pain may happen when you: Suddenly change position, such as quickly going from a sitting to standing position. Do physical activity. Cough or sneeze. Follow these instructions at home: Managing pain  When the pain starts, relax. Then, try any of these methods to help with the pain: Sit down. Flex your knees up to your abdomen. Lie on your side with one pillow under your abdomen and another pillow between your legs. Sit in a warm bath for 15-20 minutes or until the pain goes away. General instructions Watch your condition for any changes. Move slowly when you sit down or stand up. Stop or reduce your physical activities if they cause pain. Avoid long walks if they cause pain. Take over-the-counter and prescription medicines only as told by your health care provider. Keep all follow-up visits. This is important. Contact a health care provider if: Your pain does not go away with treatment. You feel pain in your back that you did not have before. Your medicine is not helping. You have a fever or chills. You have nausea or vomiting. You have diarrhea. You have pain when you urinate. Get help right away if: You have pain that is a rhythmic, cramping pain similar to labor pains. Labor pains  are usually 2 minutes apart, last for about 1 minute, and involve a bearing down feeling or pressure in your pelvis. You have vaginal bleeding. These symptoms may represent a serious problem that is an emergency. Do not wait to see if the symptoms will go away. Get medical help right away. Call your local emergency services (911 in the [place] Do not drive yourself to the hospital. Summary Round ligament pain is felt in the lower abdomen or groin. This pain usually begins in the second trimester (13-28 weeks) and should only last for a few seconds when it occurs. You may notice the pain when you suddenly change position, when you cough or sneeze, or during physical activity. Relaxing, flexing your knees to your abdomen, lying on one side, or taking a warm bath may help to get rid of the pain. Contact your health care provider if the pain does not go away. This information is not intended to replace advice given to you by your health care provider. Make sure you discuss any questions you have with your health care provider. Document Revised: 06/13/2020 Document Reviewed: 06/13/2020 Elsevier Patient Education  2022 Elsevier Inc.  

## 2021-06-04 NOTE — Addendum Note (Signed)
Addended by: Mechele Claude on: 06/04/2021 11:19 AM   Modules accepted: Orders

## 2021-06-08 ENCOUNTER — Encounter: Payer: Self-pay | Admitting: Obstetrics

## 2021-06-08 LAB — URINE CULTURE

## 2021-06-11 ENCOUNTER — Other Ambulatory Visit: Payer: Self-pay | Admitting: Obstetrics

## 2021-06-11 MED ORDER — CEFTRIAXONE SODIUM 500 MG IJ SOLR: 500.0000 mg | Freq: Once | INTRAMUSCULAR | Status: DC

## 2021-06-28 ENCOUNTER — Other Ambulatory Visit: Payer: Self-pay | Admitting: Obstetrics

## 2021-06-28 MED ORDER — NITROFURANTOIN MONOHYD MACRO 100 MG PO CAPS
100.0000 mg | ORAL_CAPSULE | Freq: Every day | ORAL | 1 refills | Status: DC
Start: 2021-06-28 — End: 2021-07-03

## 2021-07-03 ENCOUNTER — Ambulatory Visit (INDEPENDENT_AMBULATORY_CARE_PROVIDER_SITE_OTHER): Payer: Medicaid Other | Admitting: Obstetrics

## 2021-07-03 ENCOUNTER — Other Ambulatory Visit: Payer: Self-pay

## 2021-07-03 ENCOUNTER — Other Ambulatory Visit (HOSPITAL_COMMUNITY)
Admission: RE | Admit: 2021-07-03 | Discharge: 2021-07-03 | Disposition: A | Discharge status: Home / Self Care | Payer: Medicaid Other | Source: Ambulatory Visit | Attending: Obstetrics | Admitting: Obstetrics

## 2021-07-03 ENCOUNTER — Other Ambulatory Visit: Payer: Medicaid Other

## 2021-07-03 VITALS — BP 112/72 | HR 86 | Wt 134.2 lb

## 2021-07-03 DIAGNOSIS — Z23 Encounter for immunization: Secondary | ICD-10-CM

## 2021-07-03 DIAGNOSIS — Z3A27 27 weeks gestation of pregnancy: Secondary | ICD-10-CM | POA: Diagnosis not present

## 2021-07-03 DIAGNOSIS — N898 Other specified noninflammatory disorders of vagina: Secondary | ICD-10-CM | POA: Insufficient documentation

## 2021-07-03 DIAGNOSIS — O26893 Other specified pregnancy related conditions, third trimester: Secondary | ICD-10-CM

## 2021-07-03 DIAGNOSIS — R399 Unspecified symptoms and signs involving the genitourinary system: Secondary | ICD-10-CM

## 2021-07-03 DIAGNOSIS — Z3482 Encounter for supervision of other normal pregnancy, second trimester: Secondary | ICD-10-CM

## 2021-07-03 MED ORDER — RHO D IMMUNE GLOBULIN 1500 UNIT/2ML IJ SOSY
300.0000 ug | PREFILLED_SYRINGE | Freq: Once | INTRAMUSCULAR | Status: AC
  Administered 2021-07-03: 300 ug via INTRAMUSCULAR

## 2021-07-03 MED ORDER — TETANUS-DIPHTH-ACELL PERTUSSIS 5-2.5-18.5 LF-MCG/0.5 IM SUSY
0.5000 mL | PREFILLED_SYRINGE | Freq: Once | INTRAMUSCULAR | Status: DC
Start: 2021-07-03 — End: 2021-07-03

## 2021-07-03 MED ORDER — CEFTRIAXONE SODIUM 500 MG IJ SOLR
500.0000 mg | Freq: Once | INTRAMUSCULAR | Status: AC
Start: 2021-07-03 — End: 2021-07-03
  Administered 2021-07-03: 500 mg via INTRAMUSCULAR

## 2021-07-03 NOTE — Addendum Note (Signed)
Addended byMariane Baumgarten on: 07/03/2021 10:01 AM ? ? Modules accepted: Orders ? ?

## 2021-07-03 NOTE — Progress Notes (Signed)
[name] at [redacted]w[redacted]d. Active baby. Denies contractions, LOF, and vaginal bleeding. She reports that she is still having some vaginal discharge after being treated for BV. Swab collected. Would like Rocephin shot today as previously discussed. Mary [name] had a syncopal episode on 3/11 on which she got up from the couch, felt very lightheaded, started hyperventilating, and then briefly lost consciousness. Her husband was there and assisted her. She has not had any further episodes since then. We discussed increasing hydration, changing positions slowly, potential for anemia in pregnancy. Gayland Curry, Child psychotherapist, here to talk with family. CBC, RPR, 1-hour glucose, Rhogam today. RTC in 2 weeks. ? ?Guadlupe Spanish, CNM ?

## 2021-07-04 LAB — CBC
Hematocrit: 34.3 % (ref 34.0–46.6)
Hemoglobin: 11.4 g/dL (ref 11.1–15.9)
MCH: 30.5 pg (ref 26.6–33.0)
MCHC: 33.2 g/dL (ref 31.5–35.7)
MCV: 92 fL (ref 79–97)
Platelets: 212 x10E3/uL (ref 150–450)
RBC: 3.74 x10E6/uL — ABNORMAL LOW (ref 3.77–5.28)
RDW: 11.8 % (ref 11.7–15.4)
WBC: 9.3 x10E3/uL (ref 3.4–10.8)

## 2021-07-04 LAB — RPR: RPR Ser Ql: NONREACTIVE

## 2021-07-04 LAB — GLUCOSE, 1 HOUR GESTATIONAL: Gestational Diabetes Screen: 95 mg/dL (ref 70–139)

## 2021-07-05 LAB — CERVICOVAGINAL ANCILLARY ONLY
Bacterial Vaginitis (gardnerella): NEGATIVE
Candida Glabrata: NEGATIVE
Candida Vaginitis: NEGATIVE
Chlamydia: NEGATIVE
Comment: NEGATIVE
Comment: NEGATIVE
Comment: NEGATIVE
Comment: NEGATIVE
Comment: NEGATIVE
Comment: NORMAL
Neisseria Gonorrhea: NEGATIVE
Trichomonas: NEGATIVE

## 2021-07-16 ENCOUNTER — Encounter: Payer: Medicaid Other | Admitting: Certified Nurse Midwife

## 2021-07-16 DIAGNOSIS — Z3A29 29 weeks gestation of pregnancy: Secondary | ICD-10-CM

## 2021-07-16 DIAGNOSIS — Z3483 Encounter for supervision of other normal pregnancy, third trimester: Secondary | ICD-10-CM

## 2021-08-07 ENCOUNTER — Ambulatory Visit (INDEPENDENT_AMBULATORY_CARE_PROVIDER_SITE_OTHER): Payer: Medicaid Other | Admitting: Certified Nurse Midwife

## 2021-08-07 VITALS — BP 103/67 | HR 94 | Wt 132.2 lb

## 2021-08-07 DIAGNOSIS — Z3A32 32 weeks gestation of pregnancy: Secondary | ICD-10-CM

## 2021-08-07 DIAGNOSIS — Z3483 Encounter for supervision of other normal pregnancy, third trimester: Secondary | ICD-10-CM

## 2021-08-07 DIAGNOSIS — R399 Unspecified symptoms and signs involving the genitourinary system: Secondary | ICD-10-CM

## 2021-08-07 LAB — POCT URINALYSIS DIPSTICK OB
Bilirubin, UA: NEGATIVE
Blood, UA: NEGATIVE
Glucose, UA: NEGATIVE
Ketones, UA: 160
Nitrite, UA: NEGATIVE
Spec Grav, UA: 1.015 (ref 1.010–1.025)
Urobilinogen, UA: 0.2 E.U./dL
pH, UA: 6 (ref 5.0–8.0)

## 2021-08-07 NOTE — Progress Notes (Signed)
[name] doing well, feeling good movement. Fundus measuring 29-30 cm , discussed u/s for growth. She is in agreement will order for next visit. She has had another dizzy spell. Reviewed with pt and instructed to change positions slowly. She verbalizes understanding and notes that it does occur when she stands up. Follow up 2 wks for u/s and [name] with Missy.  ? ?Doreene Burke, CNM   ?

## 2021-08-07 NOTE — Patient Instructions (Signed)
[place]  [facility]  [address], [place], [place] [address]  Phone: [phone]  [facility] (second location)  [address] [place], [place] [address]  Phone: [phone]  [facility] ([place]) [address], [place],  [address] Phone: [phone]  [facility]  [address] ,  [address]  Phone: [phone] 

## 2021-08-10 LAB — URINE CULTURE

## 2021-08-12 ENCOUNTER — Other Ambulatory Visit: Payer: Self-pay | Admitting: Certified Nurse Midwife

## 2021-08-12 MED ORDER — NITROFURANTOIN MONOHYD MACRO 100 MG PO CAPS: 100.0000 mg | ORAL_CAPSULE | Freq: Every day | ORAL | 1 refills | Status: DC

## 2021-08-13 ENCOUNTER — Telehealth: Payer: Self-pay | Admitting: Certified Nurse Midwife

## 2021-08-13 NOTE — Telephone Encounter (Signed)
Attempted to call pt- no answer- unable to LM - sent mychart message to call to schedule rocephin injection. ?

## 2021-08-14 ENCOUNTER — Telehealth: Payer: Self-pay | Admitting: Certified Nurse Midwife

## 2021-08-14 ENCOUNTER — Other Ambulatory Visit: Payer: Self-pay | Admitting: Certified Nurse Midwife

## 2021-08-14 NOTE — Telephone Encounter (Signed)
Unable to LM- called in attempt to schedule rocephin injection this Friday  ?

## 2021-08-23 ENCOUNTER — Ambulatory Visit (INDEPENDENT_AMBULATORY_CARE_PROVIDER_SITE_OTHER): Payer: Medicaid Other | Admitting: Obstetrics

## 2021-08-23 ENCOUNTER — Ambulatory Visit (INDEPENDENT_AMBULATORY_CARE_PROVIDER_SITE_OTHER): Payer: Medicaid Other

## 2021-08-23 VITALS — BP 112/72 | HR 87 | Wt 135.0 lb

## 2021-08-23 DIAGNOSIS — O2333 Infections of other parts of urinary tract in pregnancy, third trimester: Secondary | ICD-10-CM

## 2021-08-23 DIAGNOSIS — Z3483 Encounter for supervision of other normal pregnancy, third trimester: Secondary | ICD-10-CM

## 2021-08-23 DIAGNOSIS — Z3A32 32 weeks gestation of pregnancy: Secondary | ICD-10-CM | POA: Diagnosis not present

## 2021-08-23 DIAGNOSIS — Z3A35 35 weeks gestation of pregnancy: Secondary | ICD-10-CM

## 2021-08-23 DIAGNOSIS — R399 Unspecified symptoms and signs involving the genitourinary system: Secondary | ICD-10-CM | POA: Diagnosis not present

## 2021-08-23 DIAGNOSIS — O2343 Unspecified infection of urinary tract in pregnancy, third trimester: Secondary | ICD-10-CM

## 2021-08-23 LAB — POCT URINALYSIS DIPSTICK OB
Bilirubin, UA: NEGATIVE
Glucose, UA: NEGATIVE
Nitrite, UA: NEGATIVE
Spec Grav, UA: 1.02 (ref 1.010–1.025)
Urobilinogen, UA: 0.2 E.U./dL
pH, UA: 6.5 (ref 5.0–8.0)

## 2021-08-23 MED ORDER — CEFTRIAXONE SODIUM 500 MG IJ SOLR
500.0000 mg | Freq: Once | INTRAMUSCULAR | Status: AC
  Administered 2021-08-23: 500 mg via INTRAMUSCULAR

## 2021-08-23 NOTE — Progress Notes (Signed)
[name] at [redacted]w[redacted]d. Good fetal movement. Reports sciatic pain; dicussed comfort measure. Encouraged chiropractor. Rocephin injection today for continued UTI. Reports yeast infection; will do OTC Monistat. Reviewed when to go to hospital. GBS and GC/chlamydia at next visit. [name] in one week. ? ?Guadlupe Spanish, CNM ?

## 2021-08-28 LAB — URINE CULTURE

## 2021-08-29 ENCOUNTER — Ambulatory Visit (INDEPENDENT_AMBULATORY_CARE_PROVIDER_SITE_OTHER): Payer: Medicaid Other | Admitting: Certified Nurse Midwife

## 2021-08-29 ENCOUNTER — Encounter: Payer: Self-pay | Admitting: Certified Nurse Midwife

## 2021-08-29 VITALS — BP 108/73 | HR 76 | Wt 139.0 lb

## 2021-08-29 DIAGNOSIS — Z3A35 35 weeks gestation of pregnancy: Secondary | ICD-10-CM

## 2021-08-29 DIAGNOSIS — Z3685 Encounter for antenatal screening for Streptococcus B: Secondary | ICD-10-CM

## 2021-08-29 DIAGNOSIS — Z3483 Encounter for supervision of other normal pregnancy, third trimester: Secondary | ICD-10-CM

## 2021-08-29 DIAGNOSIS — Z113 Encounter for screening for infections with a predominantly sexual mode of transmission: Secondary | ICD-10-CM

## 2021-08-29 LAB — POCT URINALYSIS DIPSTICK OB
Bilirubin, UA: NEGATIVE
Blood, UA: NEGATIVE
Glucose, UA: NEGATIVE
Ketones, UA: NEGATIVE
Leukocytes, UA: NEGATIVE
Nitrite, UA: NEGATIVE
POC,PROTEIN,UA: NEGATIVE
Urobilinogen, UA: 0.2 E.U./dL
pH, UA: 5 (ref 5.0–8.0)

## 2021-08-29 NOTE — Patient Instructions (Signed)

## 2021-08-29 NOTE — Progress Notes (Signed)
[name] doing well, feeling good fetal movement. GBS and cultures today. Discussed antibiotic use for positive result. She verbalizes and agrees. Follow up 1 wk for [name].   Doreene Burke, CNM

## 2021-08-29 NOTE — Addendum Note (Signed)
Addended by: Tommie Raymond on: 08/29/2021 11:39 AM   Modules accepted: Orders

## 2021-08-31 LAB — STREP GP B NAA: Strep Gp B NAA: NEGATIVE

## 2021-09-02 ENCOUNTER — Encounter: Payer: Self-pay | Admitting: Certified Nurse Midwife

## 2021-09-02 LAB — GC/CHLAMYDIA PROBE AMP
Chlamydia trachomatis, NAA: NEGATIVE
Neisseria Gonorrhoeae by PCR: NEGATIVE

## 2021-09-03 ENCOUNTER — Other Ambulatory Visit: Payer: Self-pay | Admitting: Certified Nurse Midwife

## 2021-09-03 DIAGNOSIS — O43103 Malformation of placenta, unspecified, third trimester: Secondary | ICD-10-CM

## 2021-09-03 NOTE — Progress Notes (Signed)
u

## 2021-09-05 ENCOUNTER — Ambulatory Visit (INDEPENDENT_AMBULATORY_CARE_PROVIDER_SITE_OTHER): Payer: Medicaid Other | Admitting: Obstetrics

## 2021-09-05 ENCOUNTER — Ambulatory Visit: Payer: Medicaid Other | Attending: Certified Nurse Midwife

## 2021-09-05 ENCOUNTER — Encounter: Payer: Self-pay | Admitting: *Deleted

## 2021-09-05 ENCOUNTER — Other Ambulatory Visit: Payer: Self-pay | Admitting: Certified Nurse Midwife

## 2021-09-05 ENCOUNTER — Ambulatory Visit: Payer: Medicaid Other | Admitting: *Deleted

## 2021-09-05 VITALS — BP 114/79 | HR 83 | Wt 139.1 lb

## 2021-09-05 VITALS — BP 122/70 | HR 66

## 2021-09-05 DIAGNOSIS — O4393 Unspecified placental disorder, third trimester: Secondary | ICD-10-CM | POA: Insufficient documentation

## 2021-09-05 DIAGNOSIS — O0940 Supervision of pregnancy with grand multiparity, unspecified trimester: Secondary | ICD-10-CM | POA: Diagnosis not present

## 2021-09-05 DIAGNOSIS — Z3A36 36 weeks gestation of pregnancy: Secondary | ICD-10-CM

## 2021-09-05 DIAGNOSIS — O43103 Malformation of placenta, unspecified, third trimester: Secondary | ICD-10-CM

## 2021-09-05 DIAGNOSIS — O43193 Other malformation of placenta, third trimester: Secondary | ICD-10-CM | POA: Diagnosis not present

## 2021-09-05 DIAGNOSIS — Z3483 Encounter for supervision of other normal pregnancy, third trimester: Secondary | ICD-10-CM

## 2021-09-05 DIAGNOSIS — O360130 Maternal care for anti-D [Rh] antibodies, third trimester, not applicable or unspecified: Secondary | ICD-10-CM | POA: Diagnosis not present

## 2021-09-05 LAB — POCT URINALYSIS DIPSTICK OB
Bilirubin, UA: NEGATIVE
Blood, UA: NEGATIVE
Glucose, UA: NEGATIVE
Ketones, UA: NEGATIVE
Nitrite, UA: NEGATIVE
Spec Grav, UA: 1.01 (ref 1.010–1.025)
Urobilinogen, UA: 0.2 E.U./dL
pH, UA: 6 (ref 5.0–8.0)

## 2021-09-05 NOTE — Progress Notes (Signed)
[name] at [redacted]w[redacted]d. Good fetal movement. Denies ctx, LOF, vaginal bleeding. Lost her mucus plug. Still taking metronidazole - has been trying to remember to take it. Still having sciatica pain has been worsening. Heating pad and stretching helps a little. Has not tried chiropractor or belly binding. MFM appt today for placental issues. RTC in 1 week.  Lloyd Huger, CNM

## 2021-09-12 ENCOUNTER — Ambulatory Visit (INDEPENDENT_AMBULATORY_CARE_PROVIDER_SITE_OTHER): Payer: Medicaid Other | Admitting: Obstetrics

## 2021-09-12 VITALS — BP 123/85 | HR 73 | Wt 137.6 lb

## 2021-09-12 DIAGNOSIS — Z3483 Encounter for supervision of other normal pregnancy, third trimester: Secondary | ICD-10-CM

## 2021-09-12 LAB — POCT URINALYSIS DIPSTICK OB
Bilirubin, UA: NEGATIVE
Blood, UA: NEGATIVE
Glucose, UA: NEGATIVE
Ketones, UA: NEGATIVE
Leukocytes, UA: NEGATIVE
Nitrite, UA: NEGATIVE
POC,PROTEIN,UA: NEGATIVE
Spec Grav, UA: <=1.005 — AB (ref 1.010–1.025)
Urobilinogen, UA: 0.2 E.U./dL
pH, UA: 7 (ref 5.0–8.0)

## 2021-09-12 NOTE — Progress Notes (Signed)
[name] at [redacted]w[redacted]d. Feeling tired and overwhelmed, but otherwise well. Good fetal movement. Has had a few runs of contractions with some spotting, but nothing consistent. Denies LOF.  MFM US showed normal placenta and no concerns. Reviewed techniques for encouraging labor. Requests SVE: 1/50/-2. Has things ready at home, car seat in car, childcare available for labor. Knows when/how to go to L&D. RTC in one week.  Guadlupe Spanish, CNM

## 2021-09-13 ENCOUNTER — Encounter: Payer: Medicaid Other | Admitting: Obstetrics

## 2021-09-19 ENCOUNTER — Encounter: Payer: Self-pay | Admitting: Obstetrics

## 2021-09-19 ENCOUNTER — Ambulatory Visit (INDEPENDENT_AMBULATORY_CARE_PROVIDER_SITE_OTHER): Payer: Medicaid Other | Admitting: Obstetrics

## 2021-09-19 VITALS — BP 106/71 | HR 85 | Wt 139.0 lb

## 2021-09-19 DIAGNOSIS — Z3483 Encounter for supervision of other normal pregnancy, third trimester: Secondary | ICD-10-CM

## 2021-09-19 DIAGNOSIS — Z3A38 38 weeks gestation of pregnancy: Secondary | ICD-10-CM

## 2021-09-19 LAB — POCT URINALYSIS DIPSTICK OB
Bilirubin, UA: NEGATIVE
Blood, UA: NEGATIVE
Glucose, UA: NEGATIVE
Ketones, UA: NEGATIVE
Leukocytes, UA: NEGATIVE
Nitrite, UA: NEGATIVE
POC,PROTEIN,UA: NEGATIVE
Spec Grav, UA: 1.01 (ref 1.010–1.025)
Urobilinogen, UA: 0.2 E.U./dL
pH, UA: 6 (ref 5.0–8.0)

## 2021-09-19 NOTE — Progress Notes (Signed)
[name] at [redacted]w[redacted]d. Active fetus. Feeling ready for baby. Doing all the labor encouragement things. Would like IOL at 41 weeks. Will schedule at next visit if no labor before then. Requests SVE: 2/60/-2. RTC in one week for NST/[name].  Guadlupe Spanish, CNM

## 2021-09-19 NOTE — Progress Notes (Signed)
c 

## 2021-09-23 ENCOUNTER — Inpatient Hospital Stay: Payer: Medicaid Other | Admitting: Certified Registered Nurse Anesthetist

## 2021-09-23 ENCOUNTER — Encounter: Payer: Self-pay | Admitting: Obstetrics and Gynecology

## 2021-09-23 ENCOUNTER — Other Ambulatory Visit: Payer: Self-pay

## 2021-09-23 ENCOUNTER — Inpatient Hospital Stay
Admission: EM | Admit: 2021-09-23 | Discharge: 2021-09-24 | DRG: 807 | Disposition: A | Discharge status: Home / Self Care | Payer: Medicaid Other | Attending: Certified Nurse Midwife | Admitting: Certified Nurse Midwife

## 2021-09-23 DIAGNOSIS — Z3A39 39 weeks gestation of pregnancy: Secondary | ICD-10-CM | POA: Diagnosis not present

## 2021-09-23 DIAGNOSIS — O26893 Other specified pregnancy related conditions, third trimester: Secondary | ICD-10-CM | POA: Diagnosis present

## 2021-09-23 DIAGNOSIS — O99324 Drug use complicating childbirth: Secondary | ICD-10-CM | POA: Diagnosis present

## 2021-09-23 DIAGNOSIS — O479 False labor, unspecified: Secondary | ICD-10-CM | POA: Diagnosis not present

## 2021-09-23 DIAGNOSIS — Z6791 Unspecified blood type, Rh negative: Secondary | ICD-10-CM | POA: Diagnosis not present

## 2021-09-23 DIAGNOSIS — F1290 Cannabis use, unspecified, uncomplicated: Secondary | ICD-10-CM | POA: Diagnosis present

## 2021-09-23 LAB — URINE DRUG SCREEN, QUALITATIVE (ARMC ONLY)
Amphetamines, Ur Screen: NOT DETECTED
Barbiturates, Ur Screen: NOT DETECTED
Benzodiazepine, Ur Scrn: NOT DETECTED
Cannabinoid 50 Ng, Ur ~~LOC~~: POSITIVE — AB
Cocaine Metabolite,Ur ~~LOC~~: NOT DETECTED
MDMA (Ecstasy)Ur Screen: NOT DETECTED
Methadone Scn, Ur: NOT DETECTED
Opiate, Ur Screen: NOT DETECTED
Phencyclidine (PCP) Ur S: NOT DETECTED
Tricyclic, Ur Screen: NOT DETECTED

## 2021-09-23 LAB — TYPE AND SCREEN
ABO/RH(D): O NEG
Antibody Screen: POSITIVE

## 2021-09-23 LAB — CBC
HCT: 38.9 % (ref 36.0–46.0)
Hemoglobin: 13 g/dL (ref 12.0–15.0)
MCH: 29.3 pg (ref 26.0–34.0)
MCHC: 33.4 g/dL (ref 30.0–36.0)
MCV: 87.8 fL (ref 80.0–100.0)
Platelets: 196 10*3/uL (ref 150–400)
RBC: 4.43 MIL/uL (ref 3.87–5.11)
RDW: 12.3 % (ref 11.5–15.5)
WBC: 11.3 10*3/uL — ABNORMAL HIGH (ref 4.0–10.5)
nRBC: 0 % (ref 0.0–0.2)

## 2021-09-23 LAB — RPR: RPR Ser Ql: NONREACTIVE

## 2021-09-23 MED ORDER — OXYCODONE-ACETAMINOPHEN 5-325 MG PO TABS
1.0000 | ORAL_TABLET | ORAL | Status: DC | PRN
  Administered 2021-09-24: 1 via ORAL
  Filled 2021-09-23: qty 1

## 2021-09-23 MED ORDER — IBUPROFEN 600 MG PO TABS
600.0000 mg | ORAL_TABLET | Freq: Four times a day (QID) | ORAL | Status: DC
  Administered 2021-09-23 – 2021-09-24 (×5): 600 mg via ORAL
  Filled 2021-09-23 (×5): qty 1

## 2021-09-23 MED ORDER — SIMETHICONE 80 MG PO CHEW
80.0000 mg | CHEWABLE_TABLET | ORAL | Status: DC | PRN
  Administered 2021-09-23: 80 mg via ORAL
  Filled 2021-09-23: qty 1

## 2021-09-23 MED ORDER — PRENATAL MULTIVITAMIN CH
1.0000 | ORAL_TABLET | Freq: Every day | ORAL | Status: DC
  Administered 2021-09-23 – 2021-09-24 (×2): 1 via ORAL
  Filled 2021-09-23 (×2): qty 1

## 2021-09-23 MED ORDER — AMMONIA AROMATIC IN INHA
RESPIRATORY_TRACT | Status: AC
  Filled 2021-09-23: qty 10

## 2021-09-23 MED ORDER — FENTANYL-BUPIVACAINE-NACL 0.5-0.125-0.9 MG/250ML-% EP SOLN
EPIDURAL | Status: DC | PRN
  Administered 2021-09-23: 12 mL/h via EPIDURAL

## 2021-09-23 MED ORDER — OXYTOCIN-SODIUM CHLORIDE 30-0.9 UT/500ML-% IV SOLN
1.0000 m[IU]/min | INTRAVENOUS | Status: DC
  Administered 2021-09-23: 4 m[IU]/min via INTRAVENOUS

## 2021-09-23 MED ORDER — FERROUS SULFATE 325 (65 FE) MG PO TABS
325.0000 mg | ORAL_TABLET | Freq: Every day | ORAL | Status: DC
  Administered 2021-09-24: 325 mg via ORAL
  Filled 2021-09-23: qty 1

## 2021-09-23 MED ORDER — BUPIVACAINE HCL (PF) 0.25 % IJ SOLN
INTRAMUSCULAR | Status: DC | PRN
  Administered 2021-09-23: 4 mL via EPIDURAL
  Administered 2021-09-23: 2 mL via EPIDURAL

## 2021-09-23 MED ORDER — TERBUTALINE SULFATE 1 MG/ML IJ SOLN
0.2500 mg | Freq: Once | INTRAMUSCULAR | Status: DC | PRN
Start: 2021-09-23 — End: 2021-09-23

## 2021-09-23 MED ORDER — FENTANYL CITRATE (PF) 100 MCG/2ML IJ SOLN: 50.0000 ug | INTRAMUSCULAR | Status: DC | PRN

## 2021-09-23 MED ORDER — SOD CITRATE-CITRIC ACID 500-334 MG/5ML PO SOLN: 30.0000 mL | ORAL | Status: DC | PRN

## 2021-09-23 MED ORDER — LACTATED RINGERS IV SOLN: 500.0000 mL | INTRAVENOUS | Status: DC | PRN

## 2021-09-23 MED ORDER — LIDOCAINE HCL (PF) 1 % IJ SOLN
INTRAMUSCULAR | Status: DC
Start: 2021-09-23 — End: 2021-09-23
  Filled 2021-09-23: qty 30

## 2021-09-23 MED ORDER — ACETAMINOPHEN 325 MG PO TABS
650.0000 mg | ORAL_TABLET | ORAL | Status: DC | PRN
  Administered 2021-09-23 (×2): 650 mg via ORAL
  Filled 2021-09-23 (×2): qty 2

## 2021-09-23 MED ORDER — OXYCODONE-ACETAMINOPHEN 5-325 MG PO TABS: 2.0000 | ORAL_TABLET | ORAL | Status: DC | PRN

## 2021-09-23 MED ORDER — FENTANYL-BUPIVACAINE-NACL 0.5-0.125-0.9 MG/250ML-% EP SOLN
EPIDURAL | Status: AC
  Filled 2021-09-23: qty 250

## 2021-09-23 MED ORDER — OXYTOCIN 10 UNIT/ML IJ SOLN
INTRAMUSCULAR | Status: AC
  Filled 2021-09-23: qty 2

## 2021-09-23 MED ORDER — OXYTOCIN BOLUS FROM INFUSION
333.0000 mL | Freq: Once | INTRAVENOUS | Status: AC
  Administered 2021-09-23: 333 mL via INTRAVENOUS

## 2021-09-23 MED ORDER — ONDANSETRON HCL 4 MG/2ML IJ SOLN
4.0000 mg | Freq: Four times a day (QID) | INTRAMUSCULAR | Status: DC | PRN
Start: 2021-09-23 — End: 2021-09-23
  Administered 2021-09-23: 4 mg via INTRAVENOUS
  Filled 2021-09-23: qty 2

## 2021-09-23 MED ORDER — WITCH HAZEL-GLYCERIN EX PADS
1.0000 "application " | MEDICATED_PAD | CUTANEOUS | Status: DC | PRN
  Administered 2021-09-23: 1 via TOPICAL
  Filled 2021-09-23: qty 100

## 2021-09-23 MED ORDER — BENZOCAINE-MENTHOL 20-0.5 % EX AERO
1.0000 | INHALATION_SPRAY | CUTANEOUS | Status: DC | PRN
Start: 2021-09-23 — End: 2021-09-24
  Administered 2021-09-23: 1 via TOPICAL
  Filled 2021-09-23: qty 56

## 2021-09-23 MED ORDER — DOCUSATE SODIUM 100 MG PO CAPS
100.0000 mg | ORAL_CAPSULE | Freq: Two times a day (BID) | ORAL | Status: DC
  Administered 2021-09-24: 100 mg via ORAL
  Filled 2021-09-23: qty 1

## 2021-09-23 MED ORDER — LIDOCAINE HCL (PF) 1 % IJ SOLN
INTRAMUSCULAR | Status: DC | PRN
  Administered 2021-09-23: 3 mL

## 2021-09-23 MED ORDER — ONDANSETRON HCL 4 MG/2ML IJ SOLN: 4.0000 mg | INTRAMUSCULAR | Status: DC | PRN

## 2021-09-23 MED ORDER — OXYTOCIN-SODIUM CHLORIDE 30-0.9 UT/500ML-% IV SOLN
2.5000 [IU]/h | INTRAVENOUS | Status: DC
  Filled 2021-09-23: qty 500

## 2021-09-23 MED ORDER — LACTATED RINGERS IV SOLN: INTRAVENOUS | Status: DC

## 2021-09-23 MED ORDER — SENNOSIDES-DOCUSATE SODIUM 8.6-50 MG PO TABS
2.0000 | ORAL_TABLET | ORAL | Status: DC
  Administered 2021-09-23: 2 via ORAL
  Filled 2021-09-23: qty 2

## 2021-09-23 MED ORDER — COCONUT OIL OIL
1.0000 "application " | TOPICAL_OIL | Status: DC | PRN
  Administered 2021-09-23: 1 via TOPICAL
  Filled 2021-09-23: qty 120

## 2021-09-23 MED ORDER — LIDOCAINE HCL (PF) 1 % IJ SOLN: 30.0000 mL | INTRAMUSCULAR | Status: DC | PRN

## 2021-09-23 MED ORDER — DIBUCAINE (PERIANAL) 1 % EX OINT
1.0000 "application " | TOPICAL_OINTMENT | CUTANEOUS | Status: DC | PRN
  Administered 2021-09-23: 1 via RECTAL
  Filled 2021-09-23: qty 28

## 2021-09-23 MED ORDER — ONDANSETRON HCL 4 MG PO TABS: 4.0000 mg | ORAL_TABLET | ORAL | Status: DC | PRN

## 2021-09-23 MED ORDER — LIDOCAINE-EPINEPHRINE (PF) 1.5 %-1:200000 IJ SOLN
INTRAMUSCULAR | Status: DC | PRN
  Administered 2021-09-23: 3 mL via EPIDURAL

## 2021-09-23 MED ORDER — MISOPROSTOL 200 MCG PO TABS
ORAL_TABLET | ORAL | Status: AC
  Filled 2021-09-23: qty 4

## 2021-09-23 NOTE — Plan of Care (Signed)
  Problem: Education: Goal: Knowledge of General Education information will improve Description: Including pain rating scale, medication(s)/side effects and non-pharmacologic comfort measures Outcome: Progressing   Problem: Health Behavior/Discharge Planning: Goal: Ability to manage health-related needs will improve Outcome: Progressing   Problem: Clinical Measurements: Goal: Ability to maintain clinical measurements within normal limits will improve Outcome: Progressing Goal: Will remain free from infection Outcome: Progressing Goal: Diagnostic test results will improve Outcome: Progressing   Problem: Nutrition: Goal: Adequate nutrition will be maintained Outcome: Progressing   Problem: Coping: Goal: Level of anxiety will decrease Outcome: Progressing   Problem: Elimination: Goal: Will not experience complications related to urinary retention Outcome: Progressing   Problem: Pain Managment: Goal: General experience of comfort will improve Outcome: Progressing   Problem: Safety: Goal: Ability to remain free from injury will improve Outcome: Progressing   Problem: Skin Integrity: Goal: Risk for impaired skin integrity will decrease Outcome: Progressing   Problem: Education: Goal: Knowledge of Childbirth will improve Outcome: Progressing Goal: Ability to make informed decisions regarding treatment and plan of care will improve Outcome: Progressing Goal: Ability to state and carry out methods to decrease the pain will improve Outcome: Progressing

## 2021-09-23 NOTE — Progress Notes (Signed)
LABOR NOTE   Mary [name] 30 y.o.GP@ at [redacted]w[redacted]d  SUBJECTIVE:  at bedside on birthing ball.  Analgesia: Labor support without medications  OBJECTIVE:  BP 111/71   Pulse 75   Temp 98 F (36.7 C) (Oral)   Resp 16   Ht 4\' 9"  (1.448 m)   Wt 63 kg   LMP 12/21/2020 (Exact Date)   SpO2 100%   BMI 30.08 kg/m  No intake/output data recorded.   CERVIX:SVE:    deferred, pt on birthing ball Dilation: 5 Effacement (%): 80 Station: -1 Exam by:: A Borna Wessinger CONTRACTIONS: regular, every 2-3 minutes FHR: Fetal heart tracing reviewed. Baseline: 120 bpm, Variability: Good {> 6 bpm), Accelerations: Reactive, and Decelerations: Absent Category I    Labs: Lab Results  Component Value Date   WBC 11.3 (H) 09/23/2021   HGB 13.0 09/23/2021   HCT 38.9 09/23/2021   MCV 87.8 09/23/2021   PLT 196 09/23/2021    ASSESSMENT: 1) Labor curve reviewed.       Progress: Early latent labor.     Membranes: intact            Principal Problem:   Labor and delivery, indication for care   PLAN: Discussed AROM with pt and her partner. Pt states she would like epidural , recommend epidural placement prior to AROM. She agrees. Will place epidural then rupture membranes.    11/23/2021, CNM 09/23/2021 9:28 AM

## 2021-09-23 NOTE — Anesthesia Preprocedure Evaluation (Signed)
Anesthesia Evaluation  Patient identified by MRN, date of birth, ID band Patient awake    Reviewed: Allergy & Precautions, H&P , NPO status , Patient's Chart, lab work & pertinent test results  Airway Mallampati: II  TM Distance: >3 FB Neck ROM: full    Dental no notable dental hx.    Pulmonary neg pulmonary ROS, former smoker,    Pulmonary exam normal        Cardiovascular negative cardio ROS Normal cardiovascular exam     Neuro/Psych negative neurological ROS  negative psych ROS   GI/Hepatic Neg liver ROS, GERD  ,  Endo/Other  negative endocrine ROS  Renal/GU negative Renal ROS  negative genitourinary   Musculoskeletal   Abdominal   Peds  Hematology  (+) Blood dyscrasia, anemia ,   Anesthesia Other Findings   Reproductive/Obstetrics (+) Pregnancy                             Anesthesia Physical Anesthesia Plan  ASA: 2  Anesthesia Plan: Epidural   Post-op Pain Management:    Induction:   PONV Risk Score and Plan:   Airway Management Planned:   Additional Equipment:   Intra-op Plan:   Post-operative Plan:   Informed Consent: I have reviewed the patients History and Physical, chart, labs and discussed the procedure including the risks, benefits and alternatives for the proposed anesthesia with the patient or authorized representative who has indicated his/her understanding and acceptance.       Plan Discussed with: Anesthesiologist and CRNA  Anesthesia Plan Comments:         Anesthesia Quick Evaluation

## 2021-09-23 NOTE — Anesthesia Procedure Notes (Signed)
Epidural Patient location during procedure: OB Start time: 09/23/2021 7:20 AM End time: 09/23/2021 7:32 AM  Staffing Anesthesiologist: Arita Miss, MD Resident/CRNA: Norm Salt, CRNA Performed: resident/CRNA   Preanesthetic Checklist Completed: patient identified, IV checked, site marked, risks and benefits discussed, surgical consent, monitors and equipment checked and pre-op evaluation  Epidural Patient position: sitting Prep: ChloraPrep Patient monitoring: heart rate, continuous pulse ox and blood pressure Approach: midline Location: L3-L4 Injection technique: LOR saline  Needle:  Needle type: Tuohy  Needle gauge: 17 G Needle length: 9 cm and 9 Needle insertion depth: 3 cm Catheter type: closed end flexible Catheter size: 19 Gauge Catheter at skin depth: 8 cm Test dose: negative and 1.5% lidocaine with Epi 1:200 K  Assessment Events: blood not aspirated, injection not painful, no injection resistance, no paresthesia and negative IV test  Additional Notes 1 attempt Pt. Evaluated and documentation done after procedure finished. Patient identified. Risks/Benefits/Options discussed with patient including but not limited to bleeding, infection, nerve damage, paralysis, failed block, incomplete pain control, headache, blood pressure changes, nausea, vomiting, reactions to medication both or allergic, itching and postpartum back pain. Confirmed with bedside nurse the patient's most recent platelet count. Confirmed with patient that they are not currently taking any anticoagulation, have any bleeding history or any family history of bleeding disorders. Patient expressed understanding and wished to proceed. All questions were answered. Sterile technique was used throughout the entire procedure. Please see nursing notes for vital signs. Test dose was given through epidural catheter and negative prior to continuing to dose epidural or start infusion. Warning signs of high block given  to the patient including shortness of breath, tingling/numbness in hands, complete motor block, or any concerning symptoms with instructions to call for help. Patient was given instructions on fall risk and not to get out of bed. All questions and concerns addressed with instructions to call with any issues or inadequate analgesia.    Patient tolerated the insertion well without immediate complications.Reason for block:procedure for pain

## 2021-09-23 NOTE — OB Triage Note (Signed)
Mary [name] 31 y.o. presents to Labor & Delivery triage via stretcher by EMS personnel reporting contractions every 2-3 minutes with a urge to push intermittently and vaginal pressure. She is a [id number] at [redacted]w[redacted]d . She denies signs and symptoms consistent with rupture of membranes or active vaginal bleeding. She reports positive fetal movement. External FM and TOCO applied to non-tender abdomen. Initial FHR 125. Vital signs obtained and within normal limits. Patient oriented to care environment including call bell and bed control use. [name], CNM notified of patient's arrival. Plan to admit to L&D.

## 2021-09-23 NOTE — Progress Notes (Signed)
Pt. Instructed on benefits of ambulation during labor. Pt requests to use birthing ball. RN attempted to apply monica for monitoring with no success from approximately 0426 to 0442. Wireless [place] and toco applied, FHT 120. Pt bouncing on ball at this time.

## 2021-09-23 NOTE — Discharge Summary (Signed)
Patient Name: Mary [name] DOB: Feb 05, 1991 MRN: [id number]                            Discharge Summary  Date of Admission: 09/23/2021 Date of Discharge: 09/24/2021 Delivering Provider: Philip Aspen   Admitting Diagnosis: Labor and delivery, indication for care [O75.9] at [redacted]w[redacted]d Secondary diagnosis:  Principal Problem:   Labor and delivery, indication for care   Mode of Delivery: normal spontaneous vaginal delivery              Discharge diagnosis: Term Pregnancy Delivered      Intrapartum Procedures: Atificial rupture of membranes and epidural   Post partum procedures:  None  Complications: none                     Discharge Day SOAP Note:  Progress Note - Vaginal Delivery  Mary [name] is a 31 y.o. [id number] now PP day 1 s/p Vaginal, Spontaneous . Delivery was uncomplicated  Subjective  The patient has the following complaints: has no unusual complaints Pain is controlled with current medications.   Patient is urinating without difficulty.  She is ambulating well.   Breastfeeding is going well. She would like to go home.  Objective  Vital signs: BP 110/77 (BP Location: Right Arm)   Pulse 62   Temp 97.6 F (36.4 C)   Resp 18   Ht 4\' 9"  (1.448 m)   Wt 63 kg   LMP 12/21/2020 (Exact Date)   SpO2 100%   Breastfeeding Unknown   BMI 30.08 kg/m   Physical Exam: Gen: NAD Fundus Fundal Tone: Firm  Lochia Amount: Small        Data Review Labs: Lab Results  Component Value Date   WBC 9.9 09/24/2021   HGB 11.3 (L) 09/24/2021   HCT 34.0 (L) 09/24/2021   MCV 89.5 09/24/2021   PLT 142 (L) 09/24/2021      Latest Ref Rng & Units 09/24/2021    6:24 AM 09/23/2021    3:49 AM 07/03/2021    9:52 AM  CBC  WBC 4.0 - 10.5 K/uL 9.9  11.3  9.3   Hemoglobin 12.0 - 15.0 g/dL 11.3  13.0  11.4   Hematocrit 36.0 - 46.0 % 34.0  38.9  34.3   Platelets 150 - 400 K/uL 142  196  212    O NEG  Edinburgh Score:    09/24/2021    9:00 AM   Edinburgh Postnatal Depression Scale Screening Tool  I have been able to laugh and see the funny side of things. 0  I have looked forward with enjoyment to things. 0  I have blamed myself unnecessarily when things went wrong. 0  I have been anxious or worried for no good reason. 0  I have felt scared or panicky for no good reason. 0  Things have been getting on top of me. 1  I have been so unhappy that I have had difficulty sleeping. 0  I have felt sad or miserable. 0  I have been so unhappy that I have been crying. 1  The thought of harming myself has occurred to me. 0  Edinburgh Postnatal Depression Scale Total 2    Assessment/Plan  Principal Problem:   Labor and delivery, indication for care    Plan for discharge today.  Discharge Instructions: Per After Visit Summary. Activity: Advance as tolerated. Pelvic rest for 6 weeks.  Also refer to After Visit Summary Diet: Regular Medications:  Outpatient follow up:  2 week video visit & 6 week ppv with Philip Aspen, CNM   Postpartum contraception: Depo provera injection prior to discharge  Discharged Condition: good  Discharged to: home  Newborn Data: Disposition:home with mother  Apgars: APGAR (1 MIN):  9   APGAR (5 MINS):  9   APGAR (10 MINS):     Baby Feeding: Breast     09/24/2021 9:32 AM

## 2021-09-23 NOTE — H&P (Signed)
History and Physical   HPI  Mary [name] is a 31 y.o. [id number] at [redacted]w[redacted]d Estimated Date of Delivery: 09/27/21 who is being admitted for labor management.    OB History  OB History  Gravida Para Term Preterm AB Living  7 5 5  0 1 5  SAB IAB Ectopic Multiple Live Births  1 0 0 0 5    # Outcome Date GA Lbr Len/2nd Weight Sex Delivery Anes PTL Lv  7 Current           6 Term 10/13/17 [redacted]w[redacted]d 08:18 / 00:12 3180 g M Vag-Spont EPI  LIV     Name: AFSA, MEANY Pleshette     Apgar1: 8  Apgar5: 9  5 Term 05/16/15 [redacted]w[redacted]d 03:15 / 00:27 3520 g M Vag-Spont EPI  LIV     Name: [redacted]w[redacted]d Kristalynn     Apgar1: 8  Apgar5: 9  4 Term 11/17/13 [redacted]w[redacted]d  3827 g [redacted]w[redacted]d Vag-Spont   LIV     Name: Judie Petit  3 Term 07/24/11 [redacted]w[redacted]d  3685 g F Vag-Spont   LIV  2 Term 07/16/09 [redacted]w[redacted]d  3232 g F Vag-Spont   LIV     Name: [redacted]w[redacted]d  1 SAB 2009 [redacted]w[redacted]d           PROBLEM LIST  Pregnancy complications or risks: Patient Active Problem List   Diagnosis Date Noted   Labor and delivery, indication for care 09/23/2021   Family history of first degree relative with congenital heart disease 08/20/2017   Marijuana use 07/30/2017   Supervision of other normal pregnancy, antepartum 05/14/2017   Rh negative state in antepartum period 05/14/2017    Prenatal labs and studies: ABO, Rh: O/Negative/-- (01/13 1425) Antibody: Negative (01/13 1425) Rubella: 1.61 (01/13 1425) RPR: Non Reactive (03/22 0952)  HBsAg: Negative (01/13 1425)  HIV: Non Reactive (01/13 1425)  07-20-1998-- (05/18 1209)   Past Medical History:  Diagnosis Date   Anemia affecting pregnancy    Gallstones    GERD (gastroesophageal reflux disease)      Past Surgical History:  Procedure Laterality Date   APPENDECTOMY     LAPAROSCOPIC CHOLECYSTECTOMY  10/24/2014   [redacted] wks pregnant     Medications    Current Discharge Medication List     CONTINUE these medications which have NOT CHANGED   Details  nitrofurantoin,  macrocrystal-monohydrate, (MACROBID) 100 MG capsule Take 1 capsule (100 mg total) by mouth daily. Qty: 30 capsule, Refills: 1    PRENATAL 28-0.8 MG TABS Take by mouth.    metroNIDAZOLE (FLAGYL) 500 MG tablet Take 1 tablet (500 mg total) by mouth 2 (two) times daily. Qty: 14 tablet, Refills: 0    ondansetron (ZOFRAN) 4 MG tablet Take 1 tablet (4 mg total) by mouth every 8 (eight) hours as needed for nausea or vomiting. Qty: 20 tablet, Refills: 0         Allergies  Patient has no known allergies.  Review of Systems  Constitutional: negative Eyes: negative Ears, nose, mouth, throat, and face: negative Respiratory: negative Cardiovascular: negative Gastrointestinal: negative Genitourinary:negative Integument/breast: negative Hematologic/lymphatic: negative Musculoskeletal:negative Neurological: negative Behavioral/Psych: negative Endocrine: negative Allergic/Immunologic: negative  Physical Exam  BP 117/76   Pulse 89   Ht 4\' 9"  (1.448 m)   Wt 63 kg   LMP 12/21/2020 (Exact Date)   BMI 30.08 kg/m   Lungs:  CTA B Cardio: RRR without M/R/G Abd: Soft, gravid, NT Presentation: cephalic EXT: No C/C/ 1+ Edema DTRs: 2+ B CERVIX:  Dilation: 4 Effacement (%): 80 Cervical Position: Posterior Station: -1 Presentation: Vertex Exam by:: Rella Larve RN  See Prenatal records for more detailed PE.     FHR:  Baseline: 120 bpm, Variability: Good {> 6 bpm), Accelerations: Reactive, and Decelerations: Absent  Toco: Uterine Contractions: Frequency: Every 1-2 minutes and Intensity: moderate  Test Results  No results found for this or any previous visit (from the past 24 hour(s)). Group B Strep negative  Assessment   [id number] at [redacted]w[redacted]d Estimated Date of Delivery: 09/27/21  The fetus is reassuring.   Patient Active Problem List   Diagnosis Date Noted   Labor and delivery, indication for care 09/23/2021   Family history of first degree relative with congenital heart  disease 08/20/2017   Marijuana use 07/30/2017   Supervision of other normal pregnancy, antepartum 05/14/2017   Rh negative state in antepartum period 05/14/2017    Plan  1. Admit to L&D :   2. EFM:-- Category 1 3. Stadol or Epidural if desired.   4. Admission labs  5. Dr.Crawford notified of admission  Doreene Burke 09/23/2021 3:29 AM

## 2021-09-23 NOTE — Progress Notes (Signed)
LABOR NOTE   Mary [name] 31 y.o.GP@ at [redacted]w[redacted]d  SUBJECTIVE:  Comfortable with epidural  Analgesia: Epidural  OBJECTIVE:  BP 111/71   Pulse 75   Temp 98 F (36.7 C) (Oral)   Resp 16   Ht 4\' 9"  (1.448 m)   Wt 63 kg   LMP 12/21/2020 (Exact Date)   SpO2 100%   BMI 30.08 kg/m  No intake/output data recorded.  She has shown cervical change. CERVIX: 5:  80%:   -2:   mid position:   soft SVE:   Dilation: 5 Effacement (%): 80 Station: -1 Exam by:: A Ranger Petrich CONTRACTIONS: regular, every 4-5 minutes FHR: Fetal heart tracing reviewed. Baseline: 120 bpm, Variability: Good {> 6 bpm), Accelerations: Reactive, and Decelerations: Absent Category I    Labs: Lab Results  Component Value Date   WBC 11.3 (H) 09/23/2021   HGB 13.0 09/23/2021   HCT 38.9 09/23/2021   MCV 87.8 09/23/2021   PLT 196 09/23/2021    ASSESSMENT: 1) Labor curve reviewed.       Progress: Active phase labor.     Membranes: ruptured, AROM     clear       Principal Problem:   Labor and delivery, indication for care   PLAN: Given contraction frequency has slowed since epidural placement discussed augmenting labor with pitocin . PT and her partner are in agreement to plan.    11/23/2021, CNM  09/23/2021 9:31 AM

## 2021-09-23 NOTE — Progress Notes (Signed)
LABOR NOTE   Mary [name] 31 y.o.GP@ at [redacted]w[redacted]d  SUBJECTIVE:  Comfortable with epdiruall Analgesia: Epidural  OBJECTIVE:  BP 111/73   Pulse 75   Temp 98 F (36.7 C) (Oral)   Resp 16   Ht 4\' 9"  (1.448 m)   Wt 63 kg   LMP 12/21/2020 (Exact Date)   SpO2 100%   BMI 30.08 kg/m  No intake/output data recorded.  She has shown cervical change. CERVIX: 10cm:  100%:   -1:   not felt:    SVE:   Dilation: 5 Effacement (%): 80 Station: -1 Exam by:: M Edge RN CONTRACTIONS: regular, every 2 minutes FHR: Fetal heart tracing reviewed. Baseline: 130 bpm, Variability: Good {> 6 bpm), Accelerations: Reactive, and Decelerations: Early & variableCategory II    Labs: Lab Results  Component Value Date   WBC 11.3 (H) 09/23/2021   HGB 13.0 09/23/2021   HCT 38.9 09/23/2021   MCV 87.8 09/23/2021   PLT 196 09/23/2021    ASSESSMENT: 1) Labor curve reviewed.       Progress: Active phase labor.     Membranes: ruptured, clear fluid           Principal Problem:   Labor and delivery, indication for care   PLAN: Start pushing.    11/23/2021, CNM  09/23/2021 11:50 AM

## 2021-09-24 LAB — CBC
HCT: 34 % — ABNORMAL LOW (ref 36.0–46.0)
Hemoglobin: 11.3 g/dL — ABNORMAL LOW (ref 12.0–15.0)
MCH: 29.7 pg (ref 26.0–34.0)
MCHC: 33.2 g/dL (ref 30.0–36.0)
MCV: 89.5 fL (ref 80.0–100.0)
Platelets: 142 10*3/uL — ABNORMAL LOW (ref 150–400)
RBC: 3.8 MIL/uL — ABNORMAL LOW (ref 3.87–5.11)
RDW: 12.3 % (ref 11.5–15.5)
WBC: 9.9 10*3/uL (ref 4.0–10.5)
nRBC: 0 % (ref 0.0–0.2)

## 2021-09-24 MED ORDER — MEDROXYPROGESTERONE ACETATE 150 MG/ML IM SUSP
150.0000 mg | Freq: Once | INTRAMUSCULAR | Status: AC
  Administered 2021-09-24: 150 mg via INTRAMUSCULAR
  Filled 2021-09-24: qty 1

## 2021-09-24 NOTE — Progress Notes (Signed)
1230 Reviewed discharge teaching, AVS and follow up appointments for Patient and baby. Patient verbalized understanding.

## 2021-09-24 NOTE — Final Progress Note (Signed)
FINAL PROGRESS NOTE  Post Partum Day 1 Subjective: Mary [name] is doing well. She has no complaints, up ad lib, voiding, tolerating PO, and + flatus. Breastfeeding is going well. She would like to go home today.  Objective: Blood pressure 110/77, pulse 62, temperature 97.6 F (36.4 C), resp. rate 18, height 4\' 9"  (1.448 m), weight 63 kg, last menstrual period 12/21/2020, SpO2 100 %, unknown if currently breastfeeding.  Physical Exam:  General: alert, cooperative, and appears stated age Lochia: appropriate Uterine Fundus: firm Incision: N/A DVT Evaluation: No evidence of DVT seen on physical exam.  Recent Labs    09/23/21 0349 09/24/21 0624  HGB 13.0 11.3*  HCT 38.9 34.0*    Assessment/Plan: Discharge home Depo shot prior to discharge Reviewed all discharge instructions and answered questions. Plan for video visit in 2 weeks and PP office visit in 6 weeks.   LOS: 1 day   09/26/21 09/24/2021, 9:34 AM

## 2021-09-24 NOTE — Anesthesia Postprocedure Evaluation (Signed)
Anesthesia Post Note  Patient: Mary [name]  Procedure(s) Performed: AN AD HOC LABOR EPIDURAL  Patient location during evaluation: Mother Baby Anesthesia Type: Epidural Level of consciousness: awake and alert Pain management: pain level controlled Vital Signs Assessment: post-procedure vital signs reviewed and stable Respiratory status: spontaneous breathing, nonlabored ventilation and respiratory function stable Cardiovascular status: stable Postop Assessment: no headache, no backache and epidural receding Anesthetic complications: no   No notable events documented.   Last Vitals:  Vitals:   09/24/21 0003 09/24/21 0325  BP: 127/86 109/70  Pulse: 73 (!) 56  Resp: 18 18  Temp: 36.7 C 36.6 C  SpO2: 100% 99%    Last Pain:  Vitals:   09/24/21 0325  TempSrc: Oral  PainSc:                  Jeanine Luz

## 2021-09-26 ENCOUNTER — Other Ambulatory Visit: Payer: Medicaid Other

## 2021-09-26 ENCOUNTER — Encounter: Payer: Medicaid Other | Admitting: Obstetrics and Gynecology

## 2021-09-27 ENCOUNTER — Inpatient Hospital Stay: Admit: 2021-09-27 | Payer: Self-pay

## 2022-10-01 IMAGING — US US MFM OB DETAIL+14 WK
1 series · 13 of 28 positions shown · non-contrast
Comparison: none

[Series 1: us mfm ob detail+14 wk · 102 acquisitions, 13 frames shown]
[im 4/102]
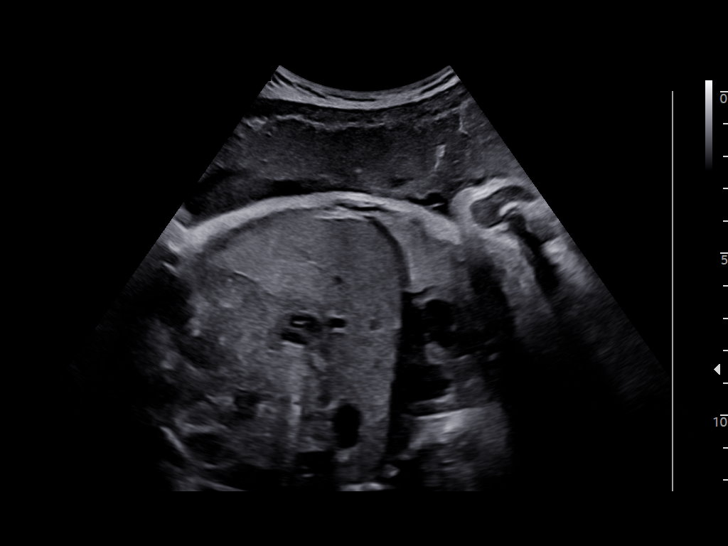
[im 12/102]
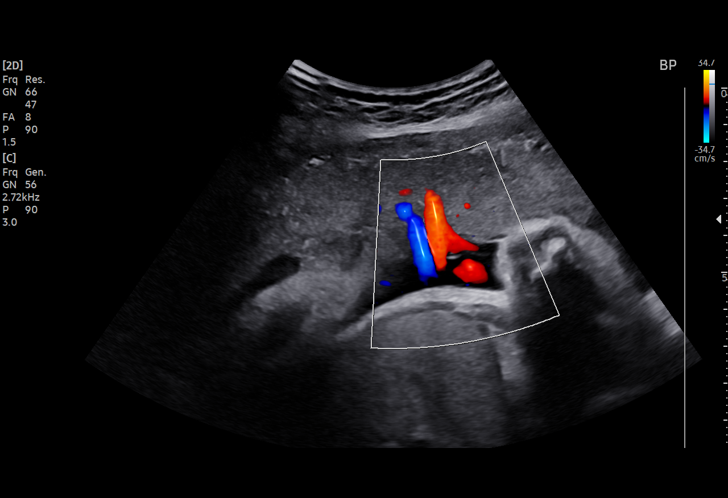
[im 19/102]
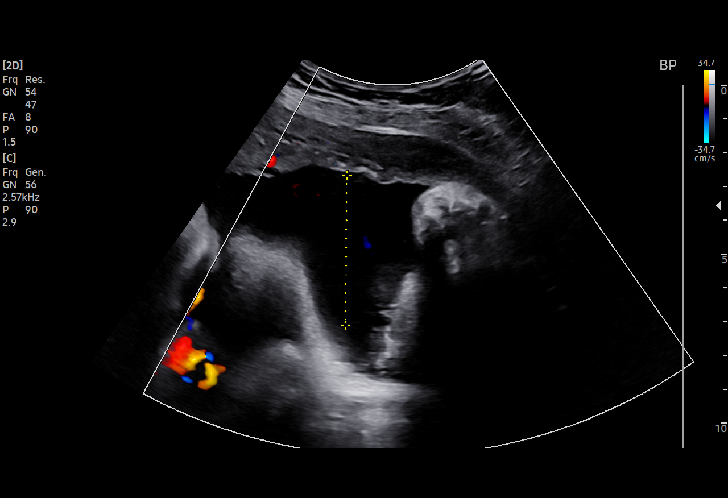
[im 27/102]
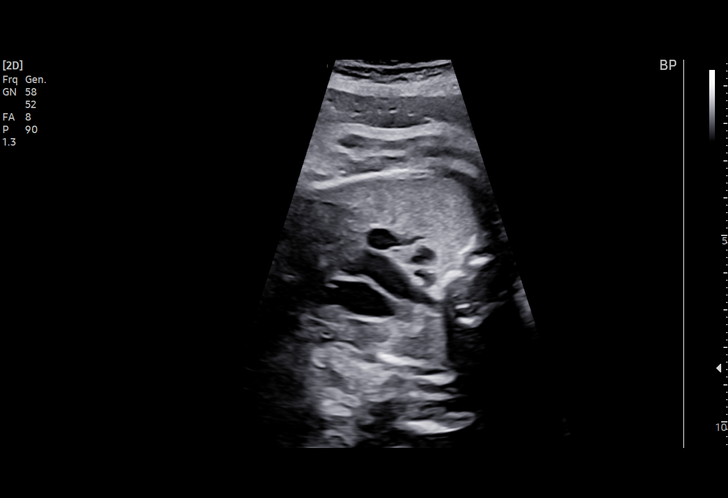
[im 34/102]
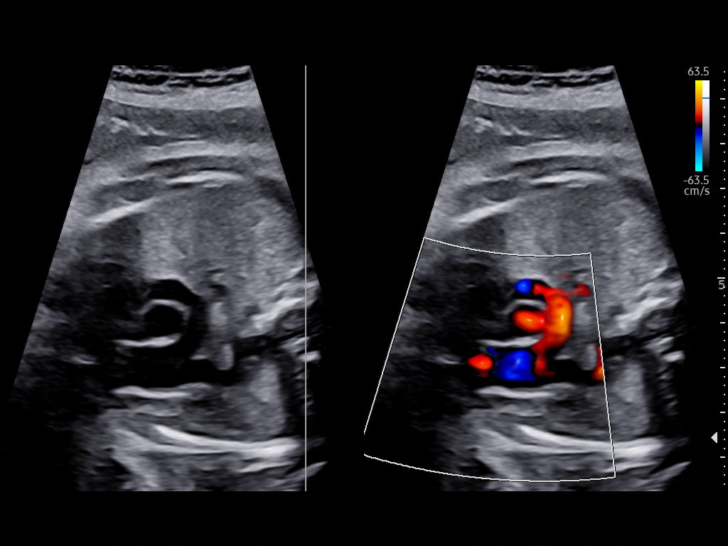
[im 42/102]
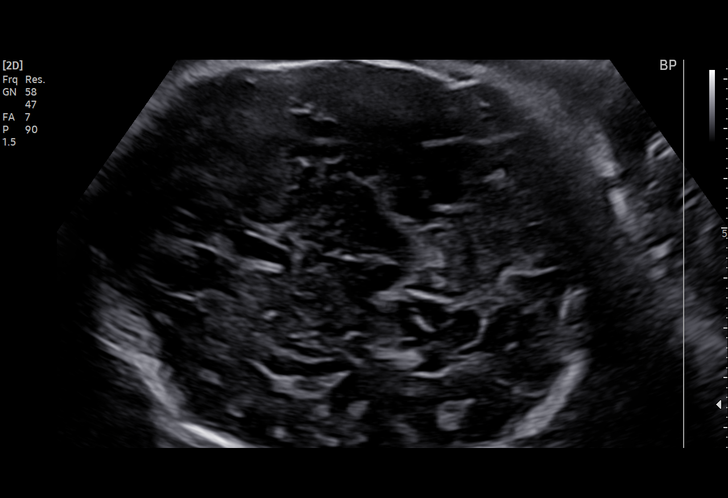
[im 53/102]
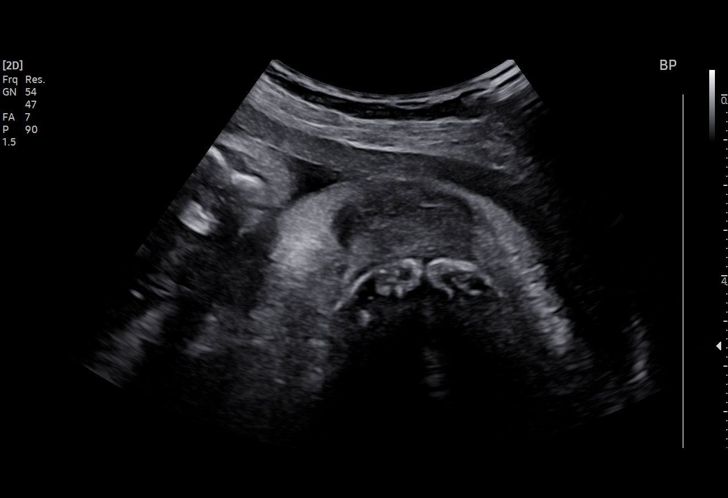
[im 60/102]
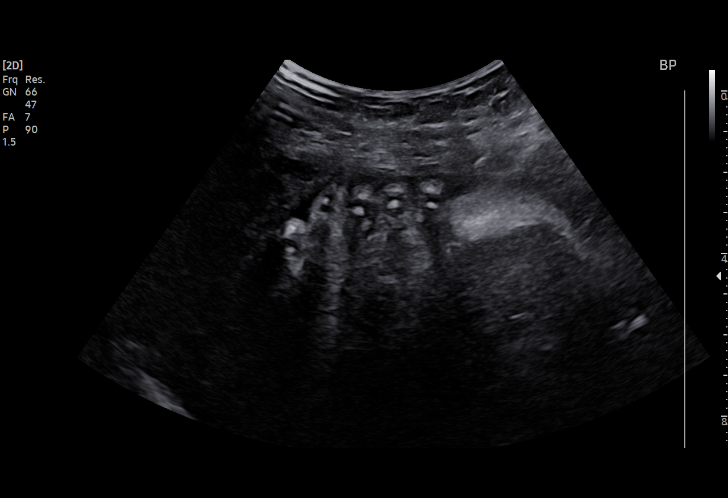
[im 68/102]
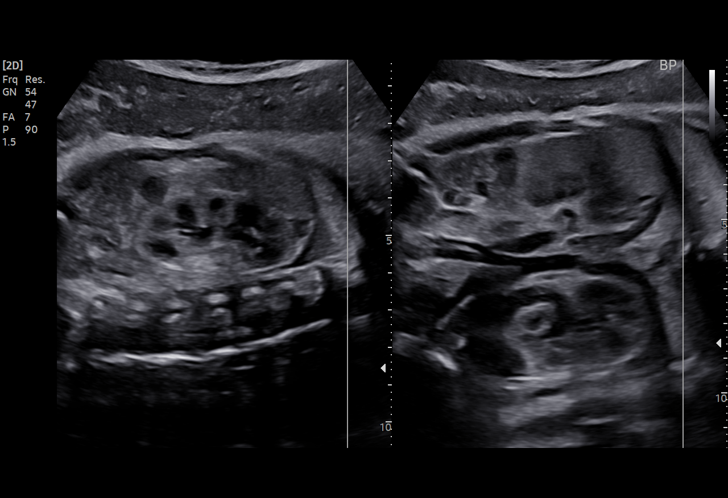
[im 75/102]
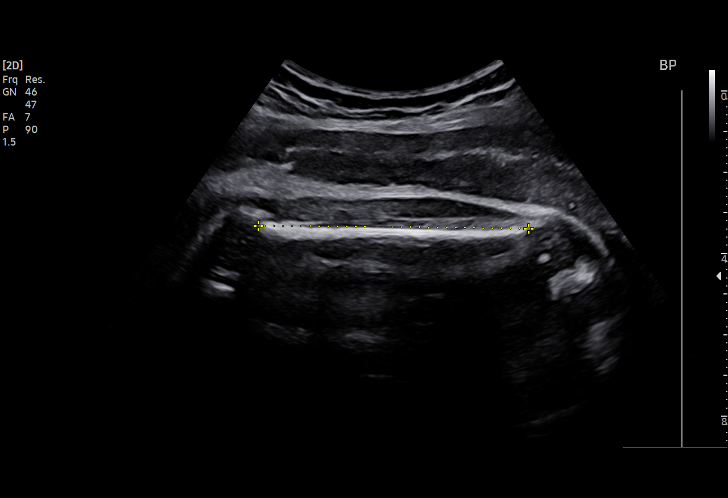
[im 83/102]
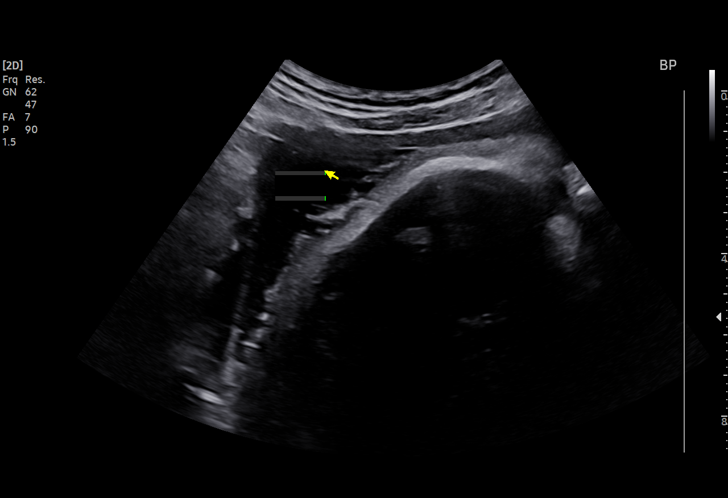
[im 90/102]
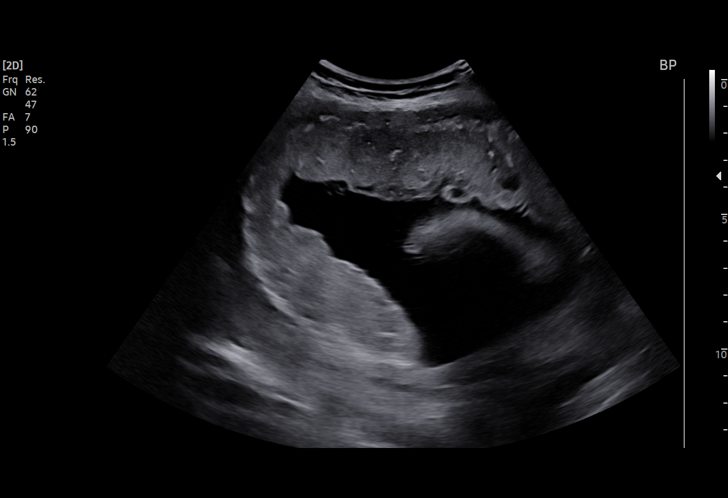
[im 98/102]
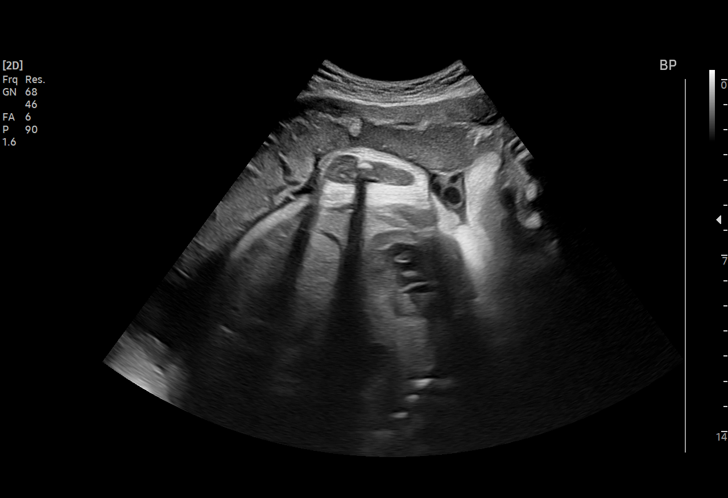

[13 of 28 positions shown; findings below may reference images not displayed]

[REDACTED] Care
                                                            [id number]

Indications

 Grand multiparity, antepartum
 Prior scan suggested placental abnormality
 in third trimester (placental thickness
 decreased)
 36 weeks gestation of pregnancy
 Encounter for antenatal screening for
 malformations
 Rh negative state in antepartum
 Negative [id number]
Fetal Evaluation

 Num Of Fetuses:         1
 Fetal Heart Rate(bpm):  130
 Cardiac Activity:       Observed
 Presentation:           Cephalic
 Placenta:               Left lateral
 P. Cord Insertion:      Visualized, central

 Amniotic Fluid
 AFI FV:      Within normal limits

 AFI Sum(cm)     %Tile       Largest Pocket(cm)
 12.68           44
 RUQ(cm)       RLQ(cm)       LUQ(cm)        LLQ(cm)
 3.35          0
Biometry

 BPD:      84.9  mm     G. Age:  34w 1d          5  %    CI:        73.88   %    70 - 86
                                                         FL/HC:      21.2   %    20.8 -
 HC:      313.7  mm     G. Age:  35w 1d        2.9  %    HC/AC:      0.98        0.92 -
 AC:      321.3  mm     G. Age:  36w 0d         40  %    FL/BPD:     78.4   %    71 - 87
 FL:       66.6  mm     G. Age:  34w 2d        3.3  %    FL/AC:      20.7   %    20 - 24
 HUM:      58.6  mm     G. Age:  33w 6d         15  %

 LV:        4.8  mm

 Est. FW:    2132  gm    5 lb 13 oz      18  %
OB History

 Blood Type:   O-
 Gravidity:    7         Term:   5         SAB:   1
 Living:       5
Gestational Age

 LMP:           36w 6d        Date:  12/21/20                 EDD:   09/27/21
 U/S Today:     34w 6d                                        EDD:   10/11/21
 Best:          36w 6d     Det. By:  LMP  (12/21/20)          EDD:   09/27/21
Anatomy

 Cranium:               Appears normal         LVOT:                   Appears normal
 Cavum:                 Appears normal         Aortic Arch:            Not well visualized
 Ventricles:            Appears normal         Ductal Arch:            Not well visualized
 Choroid Plexus:        Appears normal         Diaphragm:              Appears normal
 Cerebellum:            Not well visualized    Stomach:                Appears normal, left
                                                                       sided
 Posterior Fossa:       Not well visualized    Abdomen:                Not well visualized
 Nuchal Fold:           Not applicable (>20    Abdominal Wall:         Not well visualized
                        wks GA)
 Face:                  Appears normal         Cord Vessels:           Appears normal (3
                        (orbits and profile)                           vessel cord)
 Lips:                  Appears normal         Kidneys:                Appear normal
 Palate:                Not well visualized    Bladder:                Appears normal
 Thoracic:              Appears normal         Spine:                  Not well visualized
 Heart:                 Appears normal         Upper Extremities:      Visualized
                        (4CH, axis, and
                        situs)
 RVOT:                  Appears normal         Lower Extremities:      Not well visualized

 Other:  Fetus appears to be a male. 3VV and 3VTV visualized. Technically
         difficult due to advanced GA and fetal position.
Cervix Uterus Adnexa

 Cervix
 Not visualized (advanced GA >73wks)
 Uterus
 Normal shape and size.

 Right Ovary
 Size(cm)     3.03   x   1.56   x  1.21      Vol(ml):
 Within normal limits.

 Left Ovary
 Not visualized.
Comments

 This patient was seen for a detailed fetal anatomy scan as
 the thickness of the placenta was felt to be decreased based
 on her recent ultrasound exam.
 She denies any significant past medical history and denies
 any problems in her current pregnancy.
 She reports 5 prior uncomplicated vaginal deliveries.
 She had a cell free DNA test earlier in her pregnancy which
 indicated a low risk for trisomy 21, 18, and 13. A male fetus is
 predicted.
 She was informed that the fetal growth and amniotic fluid
 level were appropriate for her gestational age.
 There were no obvious fetal anomalies noted on today's
 ultrasound exam.  However, today's exam was limited due to
 her advanced gestational age.
 A normal-appearing anterior placenta was noted today.
 The patient was informed that anomalies may be missed due
 to technical limitations. If the fetus is in a suboptimal position
 or maternal habitus is increased, visualization of the fetus in
 the maternal uterus may be impaired.
 Should there be any further concerns regarding the fetal
 growth or other placental issues, delivery may be considered
 at around 39 weeks.

## 2023-04-15 NOTE — L&D Delivery Note (Signed)
 Delivery Note   Mary [name] is a 32 y.o. [id number] at [redacted]w[redacted]d Estimated Date of Delivery: 12/29/23  PRE-OPERATIVE DIAGNOSIS:  1) [redacted]w[redacted]d pregnancy.  2) Premature rupture of membranes, onset of labor <24h 3) Anxiety/depression in pregnancy 4) Grand multiparity  POST-OPERATIVE DIAGNOSIS:  1) [redacted]w[redacted]d pregnancy s/p Vaginal, Spontaneous Same delivered  Delivery Type: Vaginal, Spontaneous   Delivery Anesthesia: Epidural  Labor Complications:  None    ESTIMATED BLOOD LOSS: 100 ml    FINDINGS:   Information for the patient's newborn:  Aymee, Fomby [[id number]]  Live born female  Birth Weight:  pending per protocol APGAR: 8, 9  Newborn Delivery   Birth date/time: 12/31/2023 04:34:46 Delivery type: Vaginal, Spontaneous      SPECIMENS:   PLACENTA:   Appearance: Intact   Removal: Spontaneous     Disposition: discarded  Cord Blood: collected for typing, mother of baby O NEG  DISPOSITION:  Infant left in stable condition in the delivery room, with L&D personnel and mother,  NARRATIVE SUMMARY: Labor course:  Mary [name] is a [id number] at [redacted]w[redacted]d who presented to Labor & Delivery for PROM. Her initial cervical exam was 4/60/-2. Labor proceeded with augmentation. She received an epidural for pain management with good relief and she was found to be completely dilated at 0425. With excellent maternal pushing effort, she birthed a viable female infant at 5. Head birthed DOA, restituted to LOT with compound anterior (right) hand. There was a nuchal cord x 1 and somersaulted and reduced after delivery. The shoulders were birthed without difficulty. The infant was placed skin-to-skin with mother. The cord was doubly clamped and cut by FOB when pulsations ceased. The placenta delivered spontaneously and was noted to be intact with a 3VC. A perineal and vaginal examination was performed. Episiotomy/Lacerations: None  Tolerated well. Mother and baby left in  stable condition.   Harlene LITTIE Cisco, CNM 12/31/2023 4:58 AM

## 2023-06-22 ENCOUNTER — Encounter

## 2023-06-26 ENCOUNTER — Other Ambulatory Visit: Payer: Self-pay

## 2023-06-26 ENCOUNTER — Ambulatory Visit (INDEPENDENT_AMBULATORY_CARE_PROVIDER_SITE_OTHER)

## 2023-06-26 ENCOUNTER — Other Ambulatory Visit: Payer: Self-pay | Admitting: Obstetrics and Gynecology

## 2023-06-26 ENCOUNTER — Other Ambulatory Visit

## 2023-06-26 ENCOUNTER — Other Ambulatory Visit (HOSPITAL_COMMUNITY)
Admission: RE | Admit: 2023-06-26 | Discharge: 2023-06-26 | Disposition: A | Discharge status: Home / Self Care | Source: Ambulatory Visit | Attending: Obstetrics and Gynecology | Admitting: Obstetrics and Gynecology

## 2023-06-26 ENCOUNTER — Encounter

## 2023-06-26 VITALS — BP 109/60 | HR 78 | Resp 16 | Ht <= 58 in | Wt 134.7 lb

## 2023-06-26 DIAGNOSIS — Z113 Encounter for screening for infections with a predominantly sexual mode of transmission: Secondary | ICD-10-CM

## 2023-06-26 DIAGNOSIS — Z3A13 13 weeks gestation of pregnancy: Secondary | ICD-10-CM

## 2023-06-26 DIAGNOSIS — O0932 Supervision of pregnancy with insufficient antenatal care, second trimester: Secondary | ICD-10-CM

## 2023-06-26 DIAGNOSIS — Z0189 Encounter for other specified special examinations: Secondary | ICD-10-CM

## 2023-06-26 DIAGNOSIS — O3680X0 Pregnancy with inconclusive fetal viability, not applicable or unspecified: Secondary | ICD-10-CM

## 2023-06-26 DIAGNOSIS — Z1379 Encounter for other screening for genetic and chromosomal anomalies: Secondary | ICD-10-CM

## 2023-06-26 DIAGNOSIS — Z3480 Encounter for supervision of other normal pregnancy, unspecified trimester: Secondary | ICD-10-CM | POA: Diagnosis not present

## 2023-06-26 DIAGNOSIS — Z3201 Encounter for pregnancy test, result positive: Secondary | ICD-10-CM

## 2023-06-26 DIAGNOSIS — N912 Amenorrhea, unspecified: Secondary | ICD-10-CM

## 2023-06-26 DIAGNOSIS — Z0283 Encounter for blood-alcohol and blood-drug test: Secondary | ICD-10-CM | POA: Diagnosis not present

## 2023-06-26 DIAGNOSIS — Z789 Other specified health status: Secondary | ICD-10-CM

## 2023-06-26 LAB — POCT URINE PREGNANCY: Preg Test, Ur: POSITIVE — AB

## 2023-06-26 MED ORDER — VITAFOL GUMMIES 3.33-0.333-34.8 MG PO CHEW: 1.0000 | CHEWABLE_TABLET | Freq: Every day | ORAL | 3 refills | Status: AC

## 2023-06-26 NOTE — Progress Notes (Signed)
    NURSE VISIT NOTE  Subjective:    Patient ID: Mary [name], female    DOB: 08/04/1990, 33 y.o.   MRN: [id number]  HPI  Patient is a 33 y.o. [id number] female who presents for evaluation of amenorrhea. She believes she could be pregnant. Pregnancy is desired. Sexual Activity: single partner, contraception: none. Current symptoms also include: breast tenderness, morning sickness, nausea, and 2 positive home pregnancy test.  Patient's last menstrual period was 02/18/2023 (approximate). Last period was normal.    Objective:    BP 109/60   Pulse 78   Resp 16   Ht 4\' 9"  (1.448 m)   Wt 134 lb 11.2 oz (61.1 kg)   LMP 02/18/2023 (Approximate)   BMI 29.15 kg/m   Lab Review  No results found for any visits on 06/26/23.  Assessment:   1. Amenorrhea   2. Date of last menstrual period (LMP) unknown     Plan:   Pregnancy Test: Positive  Estimated Date of Delivery: 11/25/23 BP Cuff Measurement taken. Cuff Size Adult Small Encouraged well-balanced diet, plenty of rest when needed, pre-natal vitamins daily and walking for exercise.  Discussed self-help for nausea, avoiding OTC medications until consulting provider or pharmacist, other than Tylenol as needed, minimal caffeine (1-2 cups daily) and avoiding alcohol.   She will schedule her nurse visit @ 7-[redacted] wks pregnant, u/s for dating @10  wk, and NOB visit at [redacted] wk pregnant.         Santiago Bumpers, CMA [facility]

## 2023-06-26 NOTE — Progress Notes (Signed)
 New OB Intake I explained I am completing New OB Intake today. We discussed her EDD of 11/25/2023 that is based on LMP of 02/18/2023. Pt is [id number]/P6016. I reviewed her allergies, medications, Medical/Surgical/OB history, and appropriate screenings. There are no cats in the home.  Based on history, this is a/an pregnancy uncomplicated . She notes no obstetrical history.  Patient Active Problem List   Diagnosis Date Noted   Labor and delivery, indication for care 09/23/2021   Family history of first degree relative with congenital heart disease 08/20/2017   Marijuana use 07/30/2017   Supervision of other normal pregnancy, antepartum 05/14/2017   Rh negative state in antepartum period 05/14/2017    Concerns addressed today: NOB confirmation and intake was done at the same time. Patient thought she was further along. I had Victorino Dike do a dating U/S and she was only [redacted]w[redacted]d.  Delivery Plans:  Plans to deliver at [facility].  Anatomy US Explained first scheduled [place] will be around 19 weeks. Pt notified to arrive at least 15 minutes early and no children allowed in U/S room.  Labs Discussed genetic screening with patient. Patient consents to genetic testing to be drawn at new OB visit. Discussed possible labs to be drawn at new OB appointment.  COVID Vaccine Patient has not had COVID vaccine.   Social Determinants of Health Food Insecurity: denies food insecurity WIC Referral: Patient is interested in referral to [facility].  Transportation: Patient denies transportation needs. Childcare: Discussed no children allowed at ultrasound appointments.   First visit review I reviewed new OB appt with pt. I explained she will have blood work and pap smear/pelvic exam if indicated. Explained pt will be seen by Leola Brazil, CNM at first visit; encounter routed to appropriate provider.     Tommie Raymond, CMA 06/26/2023  10:33 AM

## 2023-06-27 LAB — URINALYSIS, ROUTINE W REFLEX MICROSCOPIC
Bilirubin, UA: NEGATIVE
Glucose, UA: NEGATIVE
Ketones, UA: NEGATIVE
Leukocytes,UA: NEGATIVE
Nitrite, UA: NEGATIVE
RBC, UA: NEGATIVE
Specific Gravity, UA: 1.028 (ref 1.005–1.030)
Urobilinogen, Ur: 1 mg/dL (ref 0.2–1.0)
pH, UA: 6 (ref 5.0–7.5)

## 2023-06-27 LAB — CBC/D/PLT+RPR+RH+ABO+RUBIGG...
Antibody Screen: NEGATIVE
Basophils Absolute: 0 10*3/uL (ref 0.0–0.2)
Basos: 0 %
EOS (ABSOLUTE): 0.1 10*3/uL (ref 0.0–0.4)
Eos: 1 %
HCV Ab: NONREACTIVE
HIV Screen 4th Generation wRfx: NONREACTIVE
Hematocrit: 37.1 % (ref 34.0–46.6)
Hemoglobin: 12.3 g/dL (ref 11.1–15.9)
Hepatitis B Surface Ag: NEGATIVE
Immature Grans (Abs): 0.1 10*3/uL (ref 0.0–0.1)
Immature Granulocytes: 1 %
Lymphocytes Absolute: 1.6 10*3/uL (ref 0.7–3.1)
Lymphs: 17 %
MCH: 30 pg (ref 26.6–33.0)
MCHC: 33.2 g/dL (ref 31.5–35.7)
MCV: 91 fL (ref 79–97)
Monocytes Absolute: 0.5 10*3/uL (ref 0.1–0.9)
Monocytes: 5 %
Neutrophils Absolute: 6.9 10*3/uL (ref 1.4–7.0)
Neutrophils: 76 %
Platelets: 233 10*3/uL (ref 150–450)
RBC: 4.1 x10E6/uL (ref 3.77–5.28)
RDW: 13.3 % (ref 11.7–15.4)
RPR Ser Ql: NONREACTIVE
Rh Factor: NEGATIVE
Rubella Antibodies, IGG: 1.51 {index} (ref >0.99)
Varicella zoster IgG: REACTIVE
WBC: 9.1 10*3/uL (ref 3.4–10.8)

## 2023-06-27 LAB — HCV INTERPRETATION

## 2023-06-28 LAB — CULTURE, OB URINE

## 2023-06-28 LAB — URINE CULTURE, OB REFLEX

## 2023-06-29 LAB — URINE CYTOLOGY ANCILLARY ONLY
Chlamydia: NEGATIVE
Comment: NEGATIVE
Comment: NORMAL
Neisseria Gonorrhea: NEGATIVE

## 2023-06-30 LAB — MONITOR DRUG PROFILE 14(MW)
Amphetamine Scrn, Ur: NEGATIVE ng/mL
BARBITURATE SCREEN URINE: NEGATIVE ng/mL
BENZODIAZEPINE SCREEN, URINE: NEGATIVE ng/mL
Buprenorphine, Urine: NEGATIVE ng/mL
Cocaine (Metab) Scrn, Ur: NEGATIVE ng/mL
Creatinine(Crt), U: 227.3 mg/dL (ref 20.0–300.0)
Fentanyl, Urine: NEGATIVE pg/mL
Meperidine Screen, Urine: NEGATIVE ng/mL
Methadone Screen, Urine: NEGATIVE ng/mL
OXYCODONE+OXYMORPHONE UR QL SCN: NEGATIVE ng/mL
Opiate Scrn, Ur: NEGATIVE ng/mL
Ph of Urine: 5.8 (ref 4.5–8.9)
Phencyclidine Qn, Ur: NEGATIVE ng/mL
Propoxyphene Scrn, Ur: NEGATIVE ng/mL
SPECIFIC GRAVITY: 1.026
Tramadol Screen, Urine: NEGATIVE ng/mL

## 2023-06-30 LAB — CANNABINOID (GC/MS), URINE
Cannabinoid: POSITIVE — AB
Carboxy THC (GC/MS): >750 ng/mL

## 2023-06-30 LAB — NICOTINE SCREEN, URINE: Cotinine Ql Scrn, Ur: NEGATIVE ng/mL

## 2023-07-01 ENCOUNTER — Ambulatory Visit (INDEPENDENT_AMBULATORY_CARE_PROVIDER_SITE_OTHER)

## 2023-07-01 VITALS — BP 108/68 | HR 101 | Wt 138.0 lb

## 2023-07-01 DIAGNOSIS — Z3482 Encounter for supervision of other normal pregnancy, second trimester: Secondary | ICD-10-CM

## 2023-07-01 DIAGNOSIS — D649 Anemia, unspecified: Secondary | ICD-10-CM

## 2023-07-01 DIAGNOSIS — Z3A14 14 weeks gestation of pregnancy: Secondary | ICD-10-CM | POA: Diagnosis not present

## 2023-07-01 DIAGNOSIS — Z13220 Encounter for screening for lipoid disorders: Secondary | ICD-10-CM

## 2023-07-01 DIAGNOSIS — Z1379 Encounter for other screening for genetic and chromosomal anomalies: Secondary | ICD-10-CM

## 2023-07-01 DIAGNOSIS — Z113 Encounter for screening for infections with a predominantly sexual mode of transmission: Secondary | ICD-10-CM

## 2023-07-01 DIAGNOSIS — Z3480 Encounter for supervision of other normal pregnancy, unspecified trimester: Secondary | ICD-10-CM

## 2023-07-01 DIAGNOSIS — Z0184 Encounter for antibody response examination: Secondary | ICD-10-CM

## 2023-07-01 DIAGNOSIS — Z131 Encounter for screening for diabetes mellitus: Secondary | ICD-10-CM

## 2023-07-01 DIAGNOSIS — Z0283 Encounter for blood-alcohol and blood-drug test: Secondary | ICD-10-CM

## 2023-07-01 MED ORDER — FAMOTIDINE 20 MG PO TABS: 20.0000 mg | ORAL_TABLET | Freq: Two times a day (BID) | ORAL | 3 refills | Status: DC

## 2023-07-01 MED ORDER — ONDANSETRON 4 MG PO TBDP: 4.0000 mg | ORAL_TABLET | Freq: Three times a day (TID) | ORAL | 6 refills | Status: DC | PRN

## 2023-07-01 NOTE — Progress Notes (Signed)
 NEW OB HISTORY AND PHYSICAL  SUBJECTIVE:       Mary [name] is a 33 y.o. [phone] female, Patient's last menstrual period was 02/18/2023 (approximate)., Estimated Date of Delivery: 12/29/23, [redacted]w[redacted]d, presents today for establishment of Prenatal Care. She reports feeling nauseous with vomiting in mornings with some acid reflux.    Obsteric History  History of 6 term vaginal deliveries without complications.   OB History  Gravida Para Term Preterm AB Living  8 6 6  0 1 6  SAB IAB Ectopic Multiple Live Births  1 0 0 0 6    # Outcome Date GA Lbr Len/2nd Weight Sex Type Anes PTL Lv  8 Current           7 Term 09/23/21 [redacted]w[redacted]d 03:04 / 00:17 6 lb 10.2 oz (3.01 kg) M Vag-Spont EPI  LIV  6 Term 10/13/17 [redacted]w[redacted]d 08:18 / 00:12 7 lb 0.2 oz (3.18 kg) M Vag-Spont EPI  LIV  5 Term 05/16/15 [redacted]w[redacted]d 03:15 / 00:27 7 lb 12.2 oz (3.52 kg) M Vag-Spont EPI  LIV  4 Term 11/17/13 [redacted]w[redacted]d  8 lb 7 oz (3.827 kg) M Vag-Spont   LIV  3 Term 07/24/11 [redacted]w[redacted]d  8 lb 2 oz (3.685 kg) F Vag-Spont   LIV  2 Term 07/16/09 [redacted]w[redacted]d  7 lb 2 oz (3.232 kg) F Vag-Spont   LIV  1 SAB 2009 107w0d           Social history Partner/Relationship: husband Meta Hatchet Living situation: with husband and other children Work: not working Exercise: doing some squats and some stretches and walking Substance use: marijuana   Gynecologic History Pregnancy dating reviewed:  Patient's last menstrual period was 02/18/2023 (approximate). Normal Contraception: none Last Pap: 2023. Results were: normal  Other Pertinent Health History:   Past Medical History:  Diagnosis Date   Anemia affecting pregnancy    Gallstones    GERD (gastroesophageal reflux disease)     Past Surgical History:  Procedure Laterality Date   APPENDECTOMY     LAPAROSCOPIC CHOLECYSTECTOMY  10/24/2014   [redacted] wks pregnant    Current Outpatient Medications on File Prior to Visit  Medication Sig Dispense Refill   PRENATAL 28-0.8 MG TABS Take by mouth.     Prenatal Vit-Fe  Phos-FA-Omega (VITAFOL GUMMIES) 3.33-0.333-34.8 MG CHEW Chew 1 tablet by mouth daily. 34 tablet 3   No current facility-administered medications on file prior to visit.    No Known Allergies  Social History   Socioeconomic History   Marital status: Married    Spouse name: Shellia Carwin   Number of children: 6   Years of education: 8   Highest education level: Not on file  Occupational History   Occupation: House wife  Tobacco Use   Smoking status: Former    Current packs/day: 0.00    Types: Cigarettes    Quit date: 04/05/2017    Years since quitting: 6.2    Passive exposure: Current   Smokeless tobacco: Never  Vaping Use   Vaping status: Every Day   Substances: THC   Devices: per pt, vaping THC to help with nausea while preg  Substance and Sexual Activity   Alcohol use: No   Drug use: Yes    Types: Marijuana    Comment: Reports that she uses marijuana only for nausea in pregnancy   Sexual activity: Yes    Partners: Male    Birth control/protection: None  Other Topics Concern   Not on file  Social History Narrative  Not on file   Social Drivers of Health   Financial Resource Strain: Not on file  Food Insecurity: No Food Insecurity (06/26/2023)   Hunger Vital Sign    Worried About Running Out of Food in the Last Year: Never true    Ran Out of Food in the Last Year: Never true  Transportation Needs: No Transportation Needs (06/26/2023)   PRAPARE - Administrator, Civil Service (Medical): No    Lack of Transportation (Non-Medical): No  Physical Activity: Not on file  Stress: No Stress Concern Present (06/26/2023)   [employer] of Occupational Health - Occupational Stress Questionnaire    Feeling of Stress : Not at all  Social Connections: Not on file  Intimate Partner Violence: Not At Risk (06/26/2023)   Humiliation, Afraid, Rape, and Kick questionnaire    Fear of Current or Ex-Partner: No    Emotionally Abused: No    Physically Abused: No     Sexually Abused: No    Family History  Problem Relation Age of Onset   Hypertension Mother    Diabetes Mother    Lupus Mother    Hypertension Father    HIV Father     The following portions of the patient's history were reviewed and updated as appropriate: allergies, current medications, past OB history, past medical history, past surgical history, past family history, past social history, and problem list.  Constitutional: Denied constitutional symptoms, night sweats, recent illness, fatigue, fever, insomnia and weight loss.  Eyes: Denied eye symptoms, eye pain, photophobia, vision change and visual disturbance.  Ears/Nose/Throat/Neck: Denied ear, nose, throat or neck symptoms, hearing loss, nasal discharge, sinus congestion and sore throat.  Cardiovascular: Denied cardiovascular symptoms, arrhythmia, chest pain/pressure, edema, exercise intolerance, orthopnea and palpitations.  Respiratory: Denied pulmonary symptoms, asthma, pleuritic pain, productive sputum, cough, dyspnea and wheezing.  Gastrointestinal: Positive for N/V and acid reflux  Genitourinary: Denied genitourinary symptoms including symptomatic vaginal discharge, pelvic relaxation issues, and urinary complaints.  Musculoskeletal: Denied musculoskeletal symptoms, stiffness, swelling, muscle weakness and myalgia.  Dermatologic: Denied dermatology symptoms, rash and scar.  Neurologic: Denied neurology symptoms, dizziness, headache, neck pain and syncope.  Psychiatric: Denied psychiatric symptoms, anxiety and depression.  Endocrine: Denied endocrine symptoms including hot flashes and night sweats.     OBJECTIVE: Initial Physical Exam (New OB)  GENERAL APPEARANCE: alert, well appearing HEAD: normocephalic, atraumatic MOUTH: mucous membranes moist, pharynx normal without lesions THYROID: no thyromegaly or masses present BREASTS: not examined LUNGS: clear to auscultation, no wheezes, rales or rhonchi, symmetric air  entry HEART: regular rate and rhythm, S3 murmur ABDOMEN: , gravid, soft, nontender, nondistended, no abnormal masses, no epigastric pain EXTREMITIES: no redness or tenderness in the calves or thighs SKIN: normal coloration and turgor, no rashes PELVIC EXAM deferred  ASSESSMENT/PLAN  Normal pregnancy  Patient does not meet criteria for low dose ASA therapy.    Provided with Rx for ondansetron and famotidine.   Routine prenatal care. We discussed an overview of prenatal care and when to call. Reviewed diet, exercise, and weight gain recommendations in pregnancy. Discussed benefits of breastfeeding and lactation resources at [facility]. I reviewed labs and answered all questions.  Anatomy ultrasound ordered for 18-20 weeks.  NOB labs: already completed  See orders  Autumn Messing, [facility] 07/01/23 2:23 PM

## 2023-07-02 LAB — MATERNIT 21 PLUS CORE, BLOOD
Fetal Fraction: 25
Result (T21): NEGATIVE
Trisomy 13 (Patau syndrome): NEGATIVE
Trisomy 18 (Edwards syndrome): NEGATIVE
Trisomy 21 (Down syndrome): NEGATIVE

## 2023-07-29 ENCOUNTER — Encounter: Payer: Self-pay | Admitting: Licensed Practical Nurse

## 2023-07-29 ENCOUNTER — Ambulatory Visit (INDEPENDENT_AMBULATORY_CARE_PROVIDER_SITE_OTHER): Admitting: Licensed Practical Nurse

## 2023-07-29 VITALS — BP 115/80 | HR 98 | Wt 144.4 lb

## 2023-07-29 DIAGNOSIS — Z3480 Encounter for supervision of other normal pregnancy, unspecified trimester: Secondary | ICD-10-CM

## 2023-07-29 DIAGNOSIS — Z3A18 18 weeks gestation of pregnancy: Secondary | ICD-10-CM

## 2023-07-29 NOTE — Progress Notes (Signed)
    Return Prenatal Note   Subjective   32 y.o. [id number] at [redacted]w[redacted]d presents for this follow-up prenatal visit.  Patient Here with her her husband and 3 of her children, feeling pretty good, had a sharp pain in her shoulder last week-she was bending and lifting when it happened-could be related to muscle strain or gas, warning  signs reviewed  Patient reports: Movement: Present Contractions: Not present  Objective   Flow sheet Vitals: Pulse Rate: 98 BP: 115/80 Fetal Heart Rate (bpm): 144 Total weight gain: 24 lb 6.4 oz (11.1 kg)  General Appearance  No acute distress, well appearing, and well nourished Pulmonary   Normal work of breathing Neurologic   Alert and oriented to person, place, and time Psychiatric   Mood and affect within normal limits  Assessment/Plan   Plan  32 y.o. [id number] at [redacted]w[redacted]d presents for follow-up OB visit. Reviewed prenatal record including previous visit note.  Supervision of other normal pregnancy, antepartum -TWG 24 lbs, eats small frequent meals, is active with her children -Anatomy US  scheduled 4/30      No orders of the defined types were placed in this encounter.  Return in about 4 weeks (around 08/26/2023) for [name].   Future Appointments  Date Time Provider Department Center  08/12/2023  1:00 PM [facility] US  1 AOB-IMG None    For next visit:  continue with routine prenatal care     Berkley Breech [facility], CNM  07/28/2509:19 AM

## 2023-07-29 NOTE — Assessment & Plan Note (Signed)
-  TWG 24 lbs, eats small frequent meals, is active with her children -Anatomy US  scheduled 4/30

## 2023-08-10 ENCOUNTER — Other Ambulatory Visit: Payer: Self-pay

## 2023-08-10 DIAGNOSIS — Z3480 Encounter for supervision of other normal pregnancy, unspecified trimester: Secondary | ICD-10-CM

## 2023-08-10 DIAGNOSIS — Z0184 Encounter for antibody response examination: Secondary | ICD-10-CM

## 2023-08-10 DIAGNOSIS — Z113 Encounter for screening for infections with a predominantly sexual mode of transmission: Secondary | ICD-10-CM

## 2023-08-10 DIAGNOSIS — Z0283 Encounter for blood-alcohol and blood-drug test: Secondary | ICD-10-CM

## 2023-08-10 DIAGNOSIS — Z13220 Encounter for screening for lipoid disorders: Secondary | ICD-10-CM

## 2023-08-10 DIAGNOSIS — Z363 Encounter for antenatal screening for malformations: Secondary | ICD-10-CM

## 2023-08-10 DIAGNOSIS — Z1379 Encounter for other screening for genetic and chromosomal anomalies: Secondary | ICD-10-CM

## 2023-08-10 DIAGNOSIS — Z131 Encounter for screening for diabetes mellitus: Secondary | ICD-10-CM

## 2023-08-10 DIAGNOSIS — D649 Anemia, unspecified: Secondary | ICD-10-CM

## 2023-08-12 ENCOUNTER — Ambulatory Visit

## 2023-08-19 ENCOUNTER — Ambulatory Visit

## 2023-08-19 DIAGNOSIS — Z3A21 21 weeks gestation of pregnancy: Secondary | ICD-10-CM | POA: Diagnosis not present

## 2023-08-19 DIAGNOSIS — Z363 Encounter for antenatal screening for malformations: Secondary | ICD-10-CM

## 2023-08-24 ENCOUNTER — Other Ambulatory Visit: Payer: Self-pay

## 2023-08-24 DIAGNOSIS — Z3480 Encounter for supervision of other normal pregnancy, unspecified trimester: Secondary | ICD-10-CM

## 2023-08-26 ENCOUNTER — Encounter

## 2023-08-26 ENCOUNTER — Telehealth: Payer: Self-pay

## 2023-08-26 DIAGNOSIS — O26899 Other specified pregnancy related conditions, unspecified trimester: Secondary | ICD-10-CM

## 2023-08-26 DIAGNOSIS — Z8279 Family history of other congenital malformations, deformations and chromosomal abnormalities: Secondary | ICD-10-CM

## 2023-08-26 DIAGNOSIS — Z2913 Encounter for prophylactic Rho(D) immune globulin: Secondary | ICD-10-CM

## 2023-08-26 DIAGNOSIS — Z131 Encounter for screening for diabetes mellitus: Secondary | ICD-10-CM

## 2023-08-26 DIAGNOSIS — D649 Anemia, unspecified: Secondary | ICD-10-CM

## 2023-08-26 DIAGNOSIS — Z113 Encounter for screening for infections with a predominantly sexual mode of transmission: Secondary | ICD-10-CM

## 2023-08-26 DIAGNOSIS — Z3480 Encounter for supervision of other normal pregnancy, unspecified trimester: Secondary | ICD-10-CM

## 2023-08-26 DIAGNOSIS — F1290 Cannabis use, unspecified, uncomplicated: Secondary | ICD-10-CM

## 2023-08-26 NOTE — Telephone Encounter (Signed)
 Reached out to pt to reschedule [name] appt that scheduled on 08/26/2023 at 1:15 with S. Free.  Could not leave a message bc mailbox was full.

## 2023-08-27 NOTE — Telephone Encounter (Signed)
 Reached out to pt (2x) to reschedule [name] appt that was scheduled on 5/14/025 at 1:15 with S. Free.  Spoke with the husband who is on the [place].  After attempting to reschedule the appt, the husband is going to have the pt call in and reschedule the appt.

## 2023-10-02 NOTE — Telephone Encounter (Signed)
 Pt rescheduled the [name] appt on 09/30/2023 to 10/05/2023 at 3:35 with Fred Jacobsen.

## 2023-10-05 ENCOUNTER — Encounter

## 2023-10-05 ENCOUNTER — Telehealth: Payer: Self-pay

## 2023-10-05 NOTE — Telephone Encounter (Signed)
 Reached out to pt to reschedule [name] appt that was scheduled on 10/05/2023 at 3:35 with S. Free.  Left message for pt to call back to reschedule.

## 2023-10-06 NOTE — Telephone Encounter (Signed)
 Reached out to pt (2x) to reschedule [name] appt that was scheduled on 10/05/2023 at 3:35 with S. Free.  Could not leave a message bc mailbox was full.  Will send a MyChart letter.

## 2023-10-19 ENCOUNTER — Ambulatory Visit: Admitting: Advanced Practice Midwife

## 2023-10-19 ENCOUNTER — Encounter: Payer: Self-pay | Admitting: Advanced Practice Midwife

## 2023-10-19 ENCOUNTER — Other Ambulatory Visit

## 2023-10-19 VITALS — BP 101/61 | HR 80 | Wt 151.1 lb

## 2023-10-19 DIAGNOSIS — Z23 Encounter for immunization: Secondary | ICD-10-CM

## 2023-10-19 DIAGNOSIS — Z3A29 29 weeks gestation of pregnancy: Secondary | ICD-10-CM

## 2023-10-19 DIAGNOSIS — Z3483 Encounter for supervision of other normal pregnancy, third trimester: Secondary | ICD-10-CM

## 2023-10-19 DIAGNOSIS — O26899 Other specified pregnancy related conditions, unspecified trimester: Secondary | ICD-10-CM

## 2023-10-19 DIAGNOSIS — O360130 Maternal care for anti-D [Rh] antibodies, third trimester, not applicable or unspecified: Secondary | ICD-10-CM

## 2023-10-19 DIAGNOSIS — D649 Anemia, unspecified: Secondary | ICD-10-CM

## 2023-10-19 DIAGNOSIS — Z113 Encounter for screening for infections with a predominantly sexual mode of transmission: Secondary | ICD-10-CM

## 2023-10-19 DIAGNOSIS — Z6791 Unspecified blood type, Rh negative: Secondary | ICD-10-CM | POA: Diagnosis not present

## 2023-10-19 DIAGNOSIS — Z131 Encounter for screening for diabetes mellitus: Secondary | ICD-10-CM | POA: Diagnosis not present

## 2023-10-19 MED ORDER — RHO D IMMUNE GLOBULIN 1500 UNIT/2ML IJ SOSY
300.0000 ug | PREFILLED_SYRINGE | Freq: Once | INTRAMUSCULAR | Status: AC
  Administered 2023-10-19: 300 ug via INTRAMUSCULAR

## 2023-10-19 NOTE — Addendum Note (Signed)
 Addended by: DELANA SAUER on: 10/19/2023 11:21 AM   Modules accepted: Orders

## 2023-10-19 NOTE — Patient Instructions (Signed)
 Third Trimester of Pregnancy  The third trimester of pregnancy is from week 28 through week 40. This is months 7 through 9. The third trimester is a time when your baby is growing fast. Body changes during your third trimester Your body continues to change during this time. The changes usually go away after your baby is born. Physical changes You will continue to gain weight. You may get stretch marks on your hips, belly, and breasts. Your breasts will keep growing and may hurt. A yellow fluid (colostrum) may leak from your breasts. This is the first milk you're making for your baby. Your hair may grow faster and get thicker. In some cases, you may get hair loss. Your belly button may stick out. You may have more swelling in your hands, face, or ankles. Health changes You may have heartburn. You may feel short of breath. This is caused by the uterus that is now bigger. You may have more aches in the pelvis, back, or thighs. You may have more tingling or numbness in your hands, arms, and legs. You may pee more often. You may have trouble pooping (constipation) or swollen veins in the butt that can itch or get painful (hemorrhoids). Other changes You may have more problems sleeping. You may notice the baby moving lower in your belly (dropping). You may have more fluid coming from your vagina. Your joints may feel loose, and you may have pain around your pelvic bone. Follow these instructions at home: Medicines Take medicines only as told by your health care provider. Some medicines are not safe during pregnancy. Your provider may change the medicines that you take. Do not take any medicines unless told to by your provider. Take a prenatal vitamin that has at least 600 micrograms (mcg) of folic acid. Do not use herbal medicines, illegal drugs, or medicines that are not approved by your provider. Eating and drinking While you're pregnant your body needs additional nutrition to help  support your growing baby. Talk with your provider about your nutritional needs. Activity Most women are able to exercise regularly during pregnancy. Exercise routines may need to change at the end of your pregnancy. Talk to your provider about your activities and exercise routine. Relieving pain and discomfort Rest often with your legs raised if you have leg cramps or low back pain. Take warm sitz baths to soothe pain from hemorrhoids. Use hemorrhoid cream if your provider says it's okay. Wear a good, supportive bra if your breasts hurt. Do not use hot tubs, steam rooms, or saunas. Do not douche. Do not use tampons or scented pads. Safety Talk to your provider before traveling far distances. Wear your seatbelt at all times when you're in a car. Talk to your provider if someone hits you, hurts you, or yells at you. Preparing for birth To prepare for your baby: Take childbirth and breastfeeding classes. Visit the hospital and tour the maternity area. Buy a rear-facing car seat. Learn how to install it in your car. General instructions Avoid cat litter boxes and soil used by cats. These things carry germs that can cause harm to your pregnancy and your baby. Do not drink alcohol, smoke, vape, or use products with nicotine or tobacco in them. If you need help quitting, talk with your provider. Keep all follow-up visits for your third trimester. Your provider will do more exams and tests during this trimester. Write down your questions. Take them to your prenatal visits. Your provider also will: Talk with you about  your overall health. Give you advice or refer you to specialists who can help with different needs, including: Mental health and counseling. Foods and healthy eating. Ask for help if you need help with food. Where to find more information American Pregnancy Association: americanpregnancy.org [employer] of Obstetricians and Gynecologists: acog.org Office on [employer] Health:  [employer] Contact a health care provider if: You have a headache that does not go away when you take medicine. You have any of these problems: You can't eat or drink. You have nausea and vomiting. You have watery poop (diarrhea) for 2 days or more. You have pain when you pee, or your pee smells bad. You have been sick for 2 days or more and aren't getting better. Contact your provider right away if: You have any of these coming from your vagina: Abnormal discharge. Bad-smelling fluid. Bleeding. Your baby is moving less than usual. You have signs of labor: You have any contractions, belly cramping, or have pain in your pelvis or lower back before 37 weeks of pregnancy (preterm labor). You have regular contractions that are less than 5 minutes apart. Your water breaks. You have symptoms of high blood pressure or preeclampsia. These include: A severe, throbbing headache that does not go away. Sudden or extreme swelling of your face, hands, legs, or feet. Vision problems: You see spots. You have blurry vision. Your eyes are sensitive to light. If you can't reach your provider, go to an urgent care or emergency room. Get help right away if: You faint, become confused, or can't think clearly. You have chest pain or trouble breathing. You have any kind of injury, such as from a fall or a car crash. These symptoms may be an emergency. Call 911 right away. Do not wait to see if the symptoms will go away. Do not drive yourself to the hospital. This information is not intended to replace advice given to you by your health care provider. Make sure you discuss any questions you have with your health care provider. Document Revised: 01/01/2023 Document Reviewed: 08/01/2022 Elsevier Patient Education  2024 [employer].

## 2023-10-19 NOTE — Addendum Note (Signed)
 Addended by: DONELDA BURNARD CROME on: 10/19/2023 11:36 AM   Modules accepted: Orders

## 2023-10-19 NOTE — Progress Notes (Signed)
 Routine Prenatal Care Visit  Subjective  Mary [name] is a 33 y.o. [phone] at [redacted]w[redacted]d being seen today for ongoing prenatal care.  She is currently monitored for the following issues for this low-risk pregnancy and has Supervision of other normal pregnancy, antepartum; Rh negative state in antepartum period; Marijuana use; and Family history of first degree relative with congenital heart disease on their problem list.  ----------------------------------------------------------------------------------- Patient reports had episodes of fainting with previous pregnancy and she is concerned that is could happen again. Reviewed the importance of adequate hydration and protein with each meal and snack. 28 week labs and Rhogam today.   Contractions: Not present. Vag. Bleeding: None.  Movement: Present. Leaking Fluid denies.  ----------------------------------------------------------------------------------- The following portions of the patient's history were reviewed and updated as appropriate: allergies, current medications, past family history, past medical history, past social history, past surgical history and problem list. Problem list updated.  Objective  Blood pressure 101/61, pulse 80, weight 151 lb 1.6 oz (68.5 kg), last menstrual period 02/18/2023 Pregravid weight 120 lb (54.4 kg) Total Weight Gain 31 lb 1.6 oz (14.1 kg) Urinalysis: Urine Protein    Urine Glucose    Fetal Status: Fetal Heart Rate (bpm): 148 Fundal Height: 30 cm Movement: Present     General:  Alert, oriented and cooperative. Patient is in no acute distress.  Skin: Skin is warm and dry. No rash noted.   Cardiovascular: Normal heart rate noted  Respiratory: Normal respiratory effort, no problems with respiration noted  Abdomen: Soft, gravid, appropriate for gestational age. Pain/Pressure: Absent     Pelvic:  Cervical exam deferred        Extremities: Normal range of motion.  Edema: None  Mental Status: Normal mood  and affect. Normal behavior. Normal judgment and thought content.   Assessment   33 y.o. [id number] at [redacted]w[redacted]d by  12/29/2023, by Ultrasound presenting for routine prenatal visit  Plan   G8 Problems (from 06/26/23 to present)     Problem Noted Diagnosed Resolved   Supervision of other normal pregnancy, antepartum 05/14/2017 by Victor Claudell SAUNDERS, MD  No   Overview Addendum 10/19/2023 10:26 AM by Donelda Burnard CROME, CMA   Clinical Staff Provider  Office Location  [facility] Dating  12/29/2023, by U/S  Language  English Anatomy US     Flu Vaccine   Genetic Screen  NIPS: negative, female   RSV Vaccine      Covid Vaccine      TDaP vaccine   10/19/2023 Hgb A1C or  GTT Early : Third trimester :   Covid    LAB RESULTS   Rhogam  O/Negative/-- (03/14 1111)  Blood Type O/Negative/-- (03/14 1111)   RSV  Antibody Negative (03/14 1111)  Feeding Plan breast Rubella 1.51 (03/14 1111)  Contraception Depo provera  RPR Non Reactive (03/14 1111)   Circumcision no HBsAg Negative (03/14 1111)   Pediatrician  [facility] HIV Non Reactive (03/14 1111)  Support Person Coletta: Husband and Mom Varicella Reactive (03/14 1111)  Prenatal Classes  GBS  (For PCN allergy, check sensitivities)     Hep C Non Reactive (03/14 1111)   BTL Consent  Pap Diagnosis  Date Value Ref Range Status  04/26/2021   Final   - Negative for intraepithelial lesion or malignancy (NILM)    VBAC Consent  Hgb Electro      CF      SMA  Preterm labor symptoms and general obstetric precautions including but not limited to vaginal bleeding, contractions, leaking of fluid and fetal movement were reviewed in detail with the patient. Please refer to After Visit Summary for other counseling recommendations.   Return in about 2 weeks (around 11/02/2023) for rob.  Slater Rains, CNM 10/19/2023 10:48 AM

## 2023-10-21 ENCOUNTER — Ambulatory Visit: Payer: Self-pay | Admitting: Advanced Practice Midwife

## 2023-10-21 LAB — 28 WEEKS RH-PANEL
Antibody Screen: NEGATIVE
Basophils Absolute: 0 x10E3/uL (ref 0.0–0.2)
Basos: 0 %
EOS (ABSOLUTE): 0.1 x10E3/uL (ref 0.0–0.4)
Eos: 1 %
Gestational Diabetes Screen: 123 mg/dL (ref 70–139)
HIV Screen 4th Generation wRfx: NONREACTIVE
Hematocrit: 33.4 % — ABNORMAL LOW (ref 34.0–46.6)
Hemoglobin: 10.6 g/dL — ABNORMAL LOW (ref 11.1–15.9)
Immature Grans (Abs): 0.1 x10E3/uL (ref 0.0–0.1)
Immature Granulocytes: 1 %
Lymphocytes Absolute: 1.1 x10E3/uL (ref 0.7–3.1)
Lymphs: 14 %
MCH: 29.5 pg (ref 26.6–33.0)
MCHC: 31.7 g/dL (ref 31.5–35.7)
MCV: 93 fL (ref 79–97)
Monocytes Absolute: 0.3 x10E3/uL (ref 0.1–0.9)
Monocytes: 4 %
Neutrophils Absolute: 6.2 x10E3/uL (ref 1.4–7.0)
Neutrophils: 80 %
Platelets: 171 x10E3/uL (ref 150–450)
RBC: 3.59 x10E6/uL — ABNORMAL LOW (ref 3.77–5.28)
RDW: 11.9 % (ref 11.7–15.4)
RPR Ser Ql: NONREACTIVE
WBC: 7.8 x10E3/uL (ref 3.4–10.8)

## 2023-11-03 ENCOUNTER — Ambulatory Visit (INDEPENDENT_AMBULATORY_CARE_PROVIDER_SITE_OTHER): Admitting: Certified Nurse Midwife

## 2023-11-03 ENCOUNTER — Encounter: Payer: Self-pay | Admitting: Certified Nurse Midwife

## 2023-11-03 VITALS — BP 114/78 | HR 105 | Wt 150.0 lb

## 2023-11-03 DIAGNOSIS — O99013 Anemia complicating pregnancy, third trimester: Secondary | ICD-10-CM | POA: Diagnosis not present

## 2023-11-03 DIAGNOSIS — D649 Anemia, unspecified: Secondary | ICD-10-CM | POA: Diagnosis not present

## 2023-11-03 DIAGNOSIS — R55 Syncope and collapse: Secondary | ICD-10-CM | POA: Diagnosis not present

## 2023-11-03 DIAGNOSIS — Z3A32 32 weeks gestation of pregnancy: Secondary | ICD-10-CM | POA: Diagnosis not present

## 2023-11-03 DIAGNOSIS — Z1332 Encounter for screening for maternal depression: Secondary | ICD-10-CM

## 2023-11-03 DIAGNOSIS — F419 Anxiety disorder, unspecified: Secondary | ICD-10-CM | POA: Insufficient documentation

## 2023-11-03 DIAGNOSIS — Z3480 Encounter for supervision of other normal pregnancy, unspecified trimester: Secondary | ICD-10-CM

## 2023-11-03 MED ORDER — ESCITALOPRAM OXALATE 10 MG PO TABS: 10.0000 mg | ORAL_TABLET | Freq: Every day | ORAL | 1 refills | Status: AC

## 2023-11-03 MED ORDER — HYDROXYZINE HCL 25 MG PO TABS: 25.0000 mg | ORAL_TABLET | Freq: Four times a day (QID) | ORAL | 2 refills | Status: AC | PRN

## 2023-11-03 NOTE — Patient Instructions (Signed)
 Third Trimester of Pregnancy  The third trimester of pregnancy is from week 28 through week 40. This is months 7 through 9. The third trimester is a time when your baby is growing fast. Body changes during your third trimester Your body continues to change during this time. The changes usually go away after your baby is born. Physical changes You will continue to gain weight. You may get stretch marks on your hips, belly, and breasts. Your breasts will keep growing and may hurt. A yellow fluid (colostrum) may leak from your breasts. This is the first milk you're making for your baby. Your hair may grow faster and get thicker. In some cases, you may get hair loss. Your belly button may stick out. You may have more swelling in your hands, face, or ankles. Health changes You may have heartburn. You may feel short of breath. This is caused by the uterus that is now bigger. You may have more aches in the pelvis, back, or thighs. You may have more tingling or numbness in your hands, arms, and legs. You may pee more often. You may have trouble pooping (constipation) or swollen veins in the butt that can itch or get painful (hemorrhoids). Other changes You may have more problems sleeping. You may notice the baby moving lower in your belly (dropping). You may have more fluid coming from your vagina. Your joints may feel loose, and you may have pain around your pelvic bone. Follow these instructions at home: Medicines Take medicines only as told by your health care provider. Some medicines are not safe during pregnancy. Your provider may change the medicines that you take. Do not take any medicines unless told to by your provider. Take a prenatal vitamin that has at least 600 micrograms (mcg) of folic acid. Do not use herbal medicines, illegal drugs, or medicines that are not approved by your provider. Eating and drinking While you're pregnant your body needs additional nutrition to help  support your growing baby. Talk with your provider about your nutritional needs. Activity Most women are able to exercise regularly during pregnancy. Exercise routines may need to change at the end of your pregnancy. Talk to your provider about your activities and exercise routine. Relieving pain and discomfort Rest often with your legs raised if you have leg cramps or low back pain. Take warm sitz baths to soothe pain from hemorrhoids. Use hemorrhoid cream if your provider says it's okay. Wear a good, supportive bra if your breasts hurt. Do not use hot tubs, steam rooms, or saunas. Do not douche. Do not use tampons or scented pads. Safety Talk to your provider before traveling far distances. Wear your seatbelt at all times when you're in a car. Talk to your provider if someone hits you, hurts you, or yells at you. Preparing for birth To prepare for your baby: Take childbirth and breastfeeding classes. Visit the hospital and tour the maternity area. Buy a rear-facing car seat. Learn how to install it in your car. General instructions Avoid cat litter boxes and soil used by cats. These things carry germs that can cause harm to your pregnancy and your baby. Do not drink alcohol, smoke, vape, or use products with nicotine or tobacco in them. If you need help quitting, talk with your provider. Keep all follow-up visits for your third trimester. Your provider will do more exams and tests during this trimester. Write down your questions. Take them to your prenatal visits. Your provider also will: Talk with you about  your overall health. Give you advice or refer you to specialists who can help with different needs, including: Mental health and counseling. Foods and healthy eating. Ask for help if you need help with food. Where to find more information American Pregnancy Association: americanpregnancy.org [employer] of Obstetricians and Gynecologists: acog.org Office on [employer] Health:  [employer] Contact a health care provider if: You have a headache that does not go away when you take medicine. You have any of these problems: You can't eat or drink. You have nausea and vomiting. You have watery poop (diarrhea) for 2 days or more. You have pain when you pee, or your pee smells bad. You have been sick for 2 days or more and aren't getting better. Contact your provider right away if: You have any of these coming from your vagina: Abnormal discharge. Bad-smelling fluid. Bleeding. Your baby is moving less than usual. You have signs of labor: You have any contractions, belly cramping, or have pain in your pelvis or lower back before 37 weeks of pregnancy (preterm labor). You have regular contractions that are less than 5 minutes apart. Your water breaks. You have symptoms of high blood pressure or preeclampsia. These include: A severe, throbbing headache that does not go away. Sudden or extreme swelling of your face, hands, legs, or feet. Vision problems: You see spots. You have blurry vision. Your eyes are sensitive to light. If you can't reach your provider, go to an urgent care or emergency room. Get help right away if: You faint, become confused, or can't think clearly. You have chest pain or trouble breathing. You have any kind of injury, such as from a fall or a car crash. These symptoms may be an emergency. Call 911 right away. Do not wait to see if the symptoms will go away. Do not drive yourself to the hospital. This information is not intended to replace advice given to you by your health care provider. Make sure you discuss any questions you have with your health care provider. Document Revised: 01/01/2023 Document Reviewed: 08/01/2022 Elsevier Patient Education  2024 [employer].

## 2023-11-03 NOTE — Assessment & Plan Note (Signed)
 Start Lexapro  5mg  daily for 4 days, then increase to 10mg  daily. Hydroxyzine  25mg  po prn insomnia or anxiety q6h. Community resources reviewed, declines referral for counseling at this time. Follow up in one week.

## 2023-11-03 NOTE — Assessment & Plan Note (Signed)
 Reviewed kick counts and preterm labor warning signs. Instructed to call office or come to hospital with persistent headache, vision changes, regular contractions, leaking of fluid, decreased fetal movement or vaginal bleeding.

## 2023-11-03 NOTE — Progress Notes (Signed)
    Return Prenatal Note   Subjective   33 y.o. [id number] at [redacted]w[redacted]d presents for this follow-up prenatal visit.  Patient reports feeling faint at times, happened 3 days ago, she was out of it for 4-5 seconds (Denies injury or fall) and her husband had to shake her awake. This happened in her last pregnancy as well. She has a history of anemia. Endorses decreased appetite & difficulty sleeping. Endorses feeling of heart racing when this happens but denies chest pain or pressure. Patient reports: Movement: Present Contractions: Not present  Edinburgh Postnatal Depression Scale - 11/03/23 1138       Edinburgh Postnatal Depression Scale:  In the Past 7 Days   I have been able to laugh and see the funny side of things. 0    I have looked forward with enjoyment to things. 1    I have blamed myself unnecessarily when things went wrong. 1    I have been anxious or worried for no good reason. 2    I have felt scared or panicky for no good reason. 2    Things have been getting on top of me. 1    I have been so unhappy that I have had difficulty sleeping. 1    I have felt sad or miserable. 1    I have been so unhappy that I have been crying. 1    The thought of harming myself has occurred to me. 0    Edinburgh Postnatal Depression Scale Total 10         Objective   Flow sheet Vitals: Pulse Rate: (!) 105 BP: 114/78 Fundal Height: 33 cm Fetal Heart Rate (bpm): 150 Total weight gain: 30 lb (13.6 kg)  General Appearance  No acute distress, well appearing, and well nourished Pulmonary   Normal work of breathing Neurologic   Alert and oriented to person, place, and time Psychiatric   Mood and affect within normal limits, tearful while discussing mood  Assessment/Plan   Plan  33 y.o. [id number] at [redacted]w[redacted]d presents for follow-up OB visit. Reviewed prenatal record including previous visit note.  Supervision of other normal pregnancy, antepartum Reviewed kick counts and preterm labor warning  signs. Instructed to call office or come to hospital with persistent headache, vision changes, regular contractions, leaking of fluid, decreased fetal movement or vaginal bleeding.  Anxiety during pregnancy Start Lexapro  5mg  daily for 4 days, then increase to 10mg  daily. Hydroxyzine  25mg  po prn insomnia or anxiety q6h. Community resources reviewed, declines referral for counseling at this time. Follow up in one week.      Orders Placed This Encounter  Procedures   CBC   Ferritin   TSH Rfx on Abnormal to Free T4   Return in 2 weeks (on 11/17/2023) for [name].   Future Appointments  Date Time Provider Department Center  11/11/2023  4:15 PM Jayne Harlene CROME, CNM AOB-AOB None    For next visit:  one week follow-up for medication management     Harlene CROME Jayne, CNM  07/22/251:35 PM

## 2023-11-04 LAB — CBC
Hematocrit: 34.3 % (ref 34.0–46.6)
Hemoglobin: 11.3 g/dL (ref 11.1–15.9)
MCH: 30 pg (ref 26.6–33.0)
MCHC: 32.9 g/dL (ref 31.5–35.7)
MCV: 91 fL (ref 79–97)
Platelets: 176 x10E3/uL (ref 150–450)
RBC: 3.77 x10E6/uL (ref 3.77–5.28)
RDW: 12.1 % (ref 11.7–15.4)
WBC: 10.2 x10E3/uL (ref 3.4–10.8)

## 2023-11-04 LAB — TSH RFX ON ABNORMAL TO FREE T4: TSH: 1.17 u[IU]/mL (ref 0.450–4.500)

## 2023-11-04 LAB — FERRITIN: Ferritin: 12 ng/mL — ABNORMAL LOW (ref 15–150)

## 2023-11-06 ENCOUNTER — Ambulatory Visit: Payer: Self-pay | Admitting: Certified Nurse Midwife

## 2023-11-11 ENCOUNTER — Encounter: Payer: Self-pay | Admitting: Certified Nurse Midwife

## 2023-11-11 ENCOUNTER — Ambulatory Visit (INDEPENDENT_AMBULATORY_CARE_PROVIDER_SITE_OTHER): Admitting: Certified Nurse Midwife

## 2023-11-11 VITALS — BP 109/70 | HR 83 | Wt 152.0 lb

## 2023-11-11 DIAGNOSIS — F419 Anxiety disorder, unspecified: Secondary | ICD-10-CM | POA: Diagnosis not present

## 2023-11-11 DIAGNOSIS — Z3A33 33 weeks gestation of pregnancy: Secondary | ICD-10-CM | POA: Diagnosis not present

## 2023-11-11 DIAGNOSIS — O99340 Other mental disorders complicating pregnancy, unspecified trimester: Secondary | ICD-10-CM

## 2023-11-11 DIAGNOSIS — Z3480 Encounter for supervision of other normal pregnancy, unspecified trimester: Secondary | ICD-10-CM

## 2023-11-11 DIAGNOSIS — F1290 Cannabis use, unspecified, uncomplicated: Secondary | ICD-10-CM

## 2023-11-11 DIAGNOSIS — Z8279 Family history of other congenital malformations, deformations and chromosomal abnormalities: Secondary | ICD-10-CM

## 2023-11-11 DIAGNOSIS — O99343 Other mental disorders complicating pregnancy, third trimester: Secondary | ICD-10-CM | POA: Diagnosis not present

## 2023-11-11 NOTE — Assessment & Plan Note (Signed)
 Reviewed kick counts and preterm labor warning signs. Instructed to call office or come to hospital with persistent headache, vision changes, regular contractions, leaking of fluid, decreased fetal movement or vaginal bleeding.

## 2023-11-11 NOTE — Progress Notes (Signed)
    Return Prenatal Note   Subjective   33 y.o. H1E3983 at [redacted]w[redacted]d presents for this follow-up prenatal visit.  Patient reports improved mood since starting Lexapro . Denies significant side effects, tolerating without problem. Did have fainting spell today while cooking/cleaning, lasted ~4s, no fall or injury. Forgot to take Lexapro  today. Taking hydroxyzine  prn & has helped with insomnia. Patient reports: Movement: Present Contractions: Not present  Edinburgh Postnatal Depression Scale - 11/11/23 1609       Edinburgh Postnatal Depression Scale:  In the Past 7 Days   I have been able to laugh and see the funny side of things. 1    I have looked forward with enjoyment to things. 1    I have blamed myself unnecessarily when things went wrong. 1    I have been anxious or worried for no good reason. 2    I have felt scared or panicky for no good reason. 1    Things have been getting on top of me. 1    I have been so unhappy that I have had difficulty sleeping. 1    I have felt sad or miserable. 1    I have been so unhappy that I have been crying. 1    The thought of harming myself has occurred to me. 0    Edinburgh Postnatal Depression Scale Total 10         Objective   Flow sheet Vitals: Pulse Rate: 83 BP: 109/70 Fundal Height: 33 cm Fetal Heart Rate (bpm): 145 Presentation: Vertex Total weight gain: 32 lb (14.5 kg)  General Appearance  No acute distress, well appearing, and well nourished Pulmonary   Normal work of breathing Neurologic   Alert and oriented to person, place, and time Psychiatric   Mood and affect within normal limits  Assessment/Plan   Plan  33 y.o. H1E3983 at [redacted]w[redacted]d presents for follow-up OB visit. Reviewed prenatal record including previous visit note.  Supervision of other normal pregnancy, antepartum Reviewed kick counts and preterm labor warning signs. Instructed to call office or come to hospital with persistent headache, vision changes, regular  contractions, leaking of fluid, decreased fetal movement or vaginal bleeding.  Anxiety during pregnancy Some improvement on Lexapro , continue at 10mg  daily. Continue prn hydroxyzine .      Orders Placed This Encounter  Procedures   Ambulatory referral to Behavioral Health    Referral Priority:   Routine    Referral Type:   Psychiatric    Referral Reason:   Specialty Services Required    Requested Specialty:   Behavioral Health    Number of Visits Requested:   1   Return in 2 weeks (on 11/25/2023) for [name].   Future Appointments  Date Time Provider Department Center  11/25/2023  3:55 PM Sebastian Sham, CNM [facility] None    For next visit:  continue with routine prenatal care     Harlene LITTIE Cisco, CNM  07/30/254:40 PM

## 2023-11-11 NOTE — Assessment & Plan Note (Signed)
 Some improvement on Lexapro , continue at 10mg  daily. Continue prn hydroxyzine .

## 2023-11-11 NOTE — Patient Instructions (Signed)
 Third Trimester of Pregnancy  The third trimester of pregnancy is from week 28 through week 40. This is months 7 through 9. The third trimester is a time when your baby is growing fast. Body changes during your third trimester Your body continues to change during this time. The changes usually go away after your baby is born. Physical changes You will continue to gain weight. You may get stretch marks on your hips, belly, and breasts. Your breasts will keep growing and may hurt. A yellow fluid (colostrum) may leak from your breasts. This is the first milk you're making for your baby. Your hair may grow faster and get thicker. In some cases, you may get hair loss. Your belly button may stick out. You may have more swelling in your hands, face, or ankles. Health changes You may have heartburn. You may feel short of breath. This is caused by the uterus that is now bigger. You may have more aches in the pelvis, back, or thighs. You may have more tingling or numbness in your hands, arms, and legs. You may pee more often. You may have trouble pooping (constipation) or swollen veins in the butt that can itch or get painful (hemorrhoids). Other changes You may have more problems sleeping. You may notice the baby moving lower in your belly (dropping). You may have more fluid coming from your vagina. Your joints may feel loose, and you may have pain around your pelvic bone. Follow these instructions at home: Medicines Take medicines only as told by your health care provider. Some medicines are not safe during pregnancy. Your provider may change the medicines that you take. Do not take any medicines unless told to by your provider. Take a prenatal vitamin that has at least 600 micrograms (mcg) of folic acid. Do not use herbal medicines, illegal drugs, or medicines that are not approved by your provider. Eating and drinking While you're pregnant your body needs additional nutrition to help  support your growing baby. Talk with your provider about your nutritional needs. Activity Most women are able to exercise regularly during pregnancy. Exercise routines may need to change at the end of your pregnancy. Talk to your provider about your activities and exercise routine. Relieving pain and discomfort Rest often with your legs raised if you have leg cramps or low back pain. Take warm sitz baths to soothe pain from hemorrhoids. Use hemorrhoid cream if your provider says it's okay. Wear a good, supportive bra if your breasts hurt. Do not use hot tubs, steam rooms, or saunas. Do not douche. Do not use tampons or scented pads. Safety Talk to your provider before traveling far distances. Wear your seatbelt at all times when you're in a car. Talk to your provider if someone hits you, hurts you, or yells at you. Preparing for birth To prepare for your baby: Take childbirth and breastfeeding classes. Visit the hospital and tour the maternity area. Buy a rear-facing car seat. Learn how to install it in your car. General instructions Avoid cat litter boxes and soil used by cats. These things carry germs that can cause harm to your pregnancy and your baby. Do not drink alcohol, smoke, vape, or use products with nicotine or tobacco in them. If you need help quitting, talk with your provider. Keep all follow-up visits for your third trimester. Your provider will do more exams and tests during this trimester. Write down your questions. Take them to your prenatal visits. Your provider also will: Talk with you about  your overall health. Give you advice or refer you to specialists who can help with different needs, including: Mental health and counseling. Foods and healthy eating. Ask for help if you need help with food. Where to find more information American Pregnancy Association: americanpregnancy.org [employer] of Obstetricians and Gynecologists: acog.org Office on [employer] Health:  [employer] Contact a health care provider if: You have a headache that does not go away when you take medicine. You have any of these problems: You can't eat or drink. You have nausea and vomiting. You have watery poop (diarrhea) for 2 days or more. You have pain when you pee, or your pee smells bad. You have been sick for 2 days or more and aren't getting better. Contact your provider right away if: You have any of these coming from your vagina: Abnormal discharge. Bad-smelling fluid. Bleeding. Your baby is moving less than usual. You have signs of labor: You have any contractions, belly cramping, or have pain in your pelvis or lower back before 37 weeks of pregnancy (preterm labor). You have regular contractions that are less than 5 minutes apart. Your water breaks. You have symptoms of high blood pressure or preeclampsia. These include: A severe, throbbing headache that does not go away. Sudden or extreme swelling of your face, hands, legs, or feet. Vision problems: You see spots. You have blurry vision. Your eyes are sensitive to light. If you can't reach your provider, go to an urgent care or emergency room. Get help right away if: You faint, become confused, or can't think clearly. You have chest pain or trouble breathing. You have any kind of injury, such as from a fall or a car crash. These symptoms may be an emergency. Call 911 right away. Do not wait to see if the symptoms will go away. Do not drive yourself to the hospital. This information is not intended to replace advice given to you by your health care provider. Make sure you discuss any questions you have with your health care provider. Document Revised: 01/01/2023 Document Reviewed: 08/01/2022 Elsevier Patient Education  2024 [employer].

## 2023-11-25 ENCOUNTER — Encounter: Admitting: Certified Nurse Midwife

## 2023-12-01 ENCOUNTER — Telehealth: Payer: Self-pay | Admitting: Certified Nurse Midwife

## 2023-12-01 NOTE — Telephone Encounter (Signed)
 Pt has scheduled an annual exam appt for 12/29/2023 with DOROTHA Cisco via MyChart.  The pt will actually need a [name] appt.  Pt was scheduled on 11/25/2023 with A. Sebastian, but the pt did not come to the appt.  Left message for pt to call back to reschedule when we have availability.

## 2023-12-02 ENCOUNTER — Telehealth: Payer: Self-pay | Admitting: Certified Nurse Midwife

## 2023-12-02 NOTE — Telephone Encounter (Signed)
 Pt has been scheduled on 12/03/2023 at 9:15 with E. Slaughterbeck.

## 2023-12-02 NOTE — Telephone Encounter (Signed)
 Pt has been scheduled on 12/03/2023 at 9:15 with [facility].

## 2023-12-02 NOTE — Telephone Encounter (Signed)
 Reached out to pt to schedule a [name] appt in the morning on 12/03/2023 with E. Slaughterbeck.  Left message for pt to call back.

## 2023-12-03 ENCOUNTER — Other Ambulatory Visit (HOSPITAL_COMMUNITY)
Admission: RE | Admit: 2023-12-03 | Discharge: 2023-12-03 | Disposition: A | Discharge status: Home / Self Care | Source: Ambulatory Visit | Attending: Certified Nurse Midwife | Admitting: Certified Nurse Midwife

## 2023-12-03 ENCOUNTER — Ambulatory Visit (INDEPENDENT_AMBULATORY_CARE_PROVIDER_SITE_OTHER): Admitting: Certified Nurse Midwife

## 2023-12-03 ENCOUNTER — Encounter: Payer: Self-pay | Admitting: Certified Nurse Midwife

## 2023-12-03 VITALS — BP 110/74 | HR 93 | Wt 154.3 lb

## 2023-12-03 DIAGNOSIS — Z3480 Encounter for supervision of other normal pregnancy, unspecified trimester: Secondary | ICD-10-CM | POA: Diagnosis not present

## 2023-12-03 DIAGNOSIS — Z3A36 36 weeks gestation of pregnancy: Secondary | ICD-10-CM | POA: Diagnosis not present

## 2023-12-03 DIAGNOSIS — Z3685 Encounter for antenatal screening for Streptococcus B: Secondary | ICD-10-CM | POA: Diagnosis not present

## 2023-12-03 DIAGNOSIS — Z113 Encounter for screening for infections with a predominantly sexual mode of transmission: Secondary | ICD-10-CM | POA: Insufficient documentation

## 2023-12-03 NOTE — Assessment & Plan Note (Signed)
 Feeling well. Ready for baby! GBS collected today. Vertex on BSUS Reviewed labor warning signs and expectations for birth. Instructed to call office or come to hospital with persistent headache, vision changes, regular contractions, leaking of fluid, decreased fetal movement or vaginal bleeding.

## 2023-12-03 NOTE — Progress Notes (Signed)
    Return Prenatal Note   Subjective   33 y.o. H1E3983 at [redacted]w[redacted]d presents for this follow-up prenatal visit.  Patient is doing well. She has been having some Braxton Hick's contractions. Swabs done today. Patient reports: Movement: Present Contractions: Irritability  Objective   Flow sheet Vitals: Pulse Rate: 93 BP: 110/74 Fundal Height: 36 cm Fetal Heart Rate (bpm): 142 Presentation: Vertex (BSUS) Total weight gain: 34 lb 4.8 oz (15.6 kg)  General Appearance  No acute distress, well appearing, and well nourished Pulmonary   Normal work of breathing Neurologic   Alert and oriented to person, place, and time Psychiatric   Mood and affect within normal limits  Assessment/Plan   Plan  32 y.o. H1E3983 at [redacted]w[redacted]d presents for follow-up OB visit. Reviewed prenatal record including previous visit note.  Supervision of other normal pregnancy, antepartum Feeling well. Ready for baby! GBS collected today. Vertex on BSUS Reviewed labor warning signs and expectations for birth. Instructed to call office or come to hospital with persistent headache, vision changes, regular contractions, leaking of fluid, decreased fetal movement or vaginal bleeding.       Orders Placed This Encounter  Procedures   Strep Gp B NAA   No follow-ups on file.   No future appointments.  For next visit:  continue with routine prenatal care     Damien Parsley, CNM [facility] 12/02/2508:14 AM

## 2023-12-04 LAB — CERVICOVAGINAL ANCILLARY ONLY
Chlamydia: NEGATIVE
Comment: NEGATIVE
Comment: NORMAL
Neisseria Gonorrhea: NEGATIVE

## 2023-12-05 LAB — STREP GP B NAA: Strep Gp B NAA: NEGATIVE

## 2023-12-07 ENCOUNTER — Encounter: Admitting: Certified Nurse Midwife

## 2023-12-18 ENCOUNTER — Ambulatory Visit (INDEPENDENT_AMBULATORY_CARE_PROVIDER_SITE_OTHER): Admitting: Obstetrics

## 2023-12-18 VITALS — BP 111/80 | HR 107 | Wt 154.0 lb

## 2023-12-18 DIAGNOSIS — Z3483 Encounter for supervision of other normal pregnancy, third trimester: Secondary | ICD-10-CM

## 2023-12-18 DIAGNOSIS — Z3A38 38 weeks gestation of pregnancy: Secondary | ICD-10-CM

## 2023-12-18 DIAGNOSIS — Z23 Encounter for immunization: Secondary | ICD-10-CM | POA: Diagnosis not present

## 2023-12-18 DIAGNOSIS — Z3480 Encounter for supervision of other normal pregnancy, unspecified trimester: Secondary | ICD-10-CM

## 2023-12-18 DIAGNOSIS — Z641 Problems related to multiparity: Secondary | ICD-10-CM

## 2023-12-18 NOTE — Progress Notes (Signed)
    Return Prenatal Note   Subjective   33 y.o. H1E3983 at [redacted]w[redacted]d presents for this follow-up prenatal visit.  Patient Requests SVE today. Had a bit of bloody mucous when she was in the shower after an evening when she noticed frequent (but not timeable) contractions.  Patient reports: Movement: Present Contractions: Irritability  Objective   Flow sheet Vitals: Pulse Rate: (!) 107 BP: 111/80 Fundal Height: 37 cm Fetal Heart Rate (bpm): 155 Presentation: Vertex (on BSUS) Dilation: 2.5 Effacement (%): 10 Station: -3 Total weight gain: 34 lb (15.4 kg)  General Appearance  No acute distress, well appearing, and well nourished Pulmonary   Normal work of breathing Neurologic   Alert and oriented to person, place, and time Psychiatric   Mood and affect within normal limits   Assessment/Plan   Plan  33 y.o. H1E3983 at [redacted]w[redacted]d presents for follow-up OB visit. Reviewed prenatal record including previous visit note.  Supervision of other normal pregnancy, antepartum - Reviewed labor warning signs and expectations for birth. Instructed to call office or come to hospital with persistent headache, vision changes, regular contractions, leaking of fluid, decreased fetal movement or vaginal bleeding.  Orders Placed This Encounter  Procedures   Flu vaccine trivalent PF, 6mos and older(Flulaval,Afluria,Fluarix,Fluzone)   No follow-ups on file.   No future appointments.  For next visit:  continue with routine prenatal care     Charma DOMINO, CNM  09/05/252:38 PM

## 2023-12-18 NOTE — Assessment & Plan Note (Signed)
 Reviewed labor warning signs and expectations for birth. Instructed to call office or come to hospital with persistent headache, vision changes, regular contractions, leaking of fluid, decreased fetal movement or vaginal bleeding.

## 2023-12-25 ENCOUNTER — Encounter: Admitting: Obstetrics

## 2023-12-25 DIAGNOSIS — Z3480 Encounter for supervision of other normal pregnancy, unspecified trimester: Secondary | ICD-10-CM

## 2023-12-25 DIAGNOSIS — Z3A39 39 weeks gestation of pregnancy: Secondary | ICD-10-CM

## 2023-12-28 ENCOUNTER — Ambulatory Visit: Admitting: Licensed Practical Nurse

## 2023-12-28 ENCOUNTER — Encounter: Payer: Self-pay | Admitting: Licensed Practical Nurse

## 2023-12-28 VITALS — BP 131/83 | HR 109 | Wt 162.9 lb

## 2023-12-28 DIAGNOSIS — Z3A39 39 weeks gestation of pregnancy: Secondary | ICD-10-CM

## 2023-12-28 DIAGNOSIS — Z3483 Encounter for supervision of other normal pregnancy, third trimester: Secondary | ICD-10-CM

## 2023-12-28 DIAGNOSIS — Z3480 Encounter for supervision of other normal pregnancy, unspecified trimester: Secondary | ICD-10-CM

## 2023-12-28 NOTE — Progress Notes (Signed)
    Return Prenatal Note   Subjective   32 y.o. H1E3983 at [redacted]w[redacted]d presents for this follow-up prenatal visit.  Patient  Patient reports: Doing well, sleep is not the best, has runs of contractions and swollen feet. Open to  IOL at 41 weeks   Movement: Present Contractions: Irritability  Objective   Flow sheet Vitals: Pulse Rate: (!) 109 BP: 131/83 Fundal Height: 40 cm Fetal Heart Rate (bpm): 150 Presentation: Vertex Dilation: 3 Effacement (%): 50 Station: -1 Total weight gain: 42 lb 14.4 oz (19.5 kg)  General Appearance  No acute distress, well appearing, and well nourished Pulmonary   Normal work of breathing Neurologic   Alert and oriented to person, place, and time Psychiatric   Mood and affect within normal limits   Assessment/Plan   Plan  32 y.o. H1E3983 at [redacted]w[redacted]d presents for follow-up OB visit. Reviewed prenatal record including previous visit note.  Supervision of other normal pregnancy, antepartum -Her parents will watch her children while in labor -Her husband will be her labor support, they have family for PP support, denies hx PPD -IOL 9/23 at 0800, aware method of induction is based on cervical exam, she may start with Cytotec  or Pitocin , aware IOL could be post pones d/t unit needs. Orders placed  -membranes sweep done today -NST with next visit -Warning signs reviewed       No orders of the defined types were placed in this encounter.  Return in about 1 week (around 01/04/2024) for [name], NST .   Future Appointments  Date Time Provider Department Center  01/04/2024  3:15 PM [facility] ROOM [facility] None  01/04/2024  3:35 PM Justino, Eleanor HERO, CNM [facility] None    For next visit:  [name] with NST     JINNIE HERO [facility], CNM  09/15/253:16 PM

## 2023-12-28 NOTE — Assessment & Plan Note (Addendum)
-  Her parents will watch her children while in labor -Her husband will be her labor support, they have family for PP support, denies hx PPD -IOL 9/23 at 0800, aware method of induction is based on cervical exam, she may start with Cytotec  or Pitocin , aware IOL could be post pones d/t unit needs. Orders placed  -membranes sweep done today -NST with next visit -Warning signs reviewed

## 2023-12-29 ENCOUNTER — Ambulatory Visit: Admitting: Certified Nurse Midwife

## 2023-12-30 ENCOUNTER — Inpatient Hospital Stay: Admitting: Anesthesiology

## 2023-12-30 ENCOUNTER — Inpatient Hospital Stay
Admission: EM | Admit: 2023-12-30 | Discharge: 2024-01-01 | DRG: 806 | Disposition: A | Discharge status: Home / Self Care | Attending: Certified Nurse Midwife | Admitting: Certified Nurse Midwife

## 2023-12-30 ENCOUNTER — Encounter: Payer: Self-pay | Admitting: Obstetrics

## 2023-12-30 ENCOUNTER — Other Ambulatory Visit: Payer: Self-pay

## 2023-12-30 DIAGNOSIS — O4290 Premature rupture of membranes, unspecified as to length of time between rupture and onset of labor, unspecified weeks of gestation: Secondary | ICD-10-CM | POA: Diagnosis present

## 2023-12-30 DIAGNOSIS — Z87891 Personal history of nicotine dependence: Secondary | ICD-10-CM

## 2023-12-30 DIAGNOSIS — O26893 Other specified pregnancy related conditions, third trimester: Secondary | ICD-10-CM | POA: Diagnosis present

## 2023-12-30 DIAGNOSIS — Z833 Family history of diabetes mellitus: Secondary | ICD-10-CM | POA: Diagnosis not present

## 2023-12-30 DIAGNOSIS — Z6791 Unspecified blood type, Rh negative: Secondary | ICD-10-CM

## 2023-12-30 DIAGNOSIS — Z3480 Encounter for supervision of other normal pregnancy, unspecified trimester: Principal | ICD-10-CM

## 2023-12-30 DIAGNOSIS — Z30017 Encounter for initial prescription of implantable subdermal contraceptive: Secondary | ICD-10-CM | POA: Diagnosis not present

## 2023-12-30 DIAGNOSIS — O480 Post-term pregnancy: Secondary | ICD-10-CM | POA: Diagnosis not present

## 2023-12-30 DIAGNOSIS — Z79899 Other long term (current) drug therapy: Secondary | ICD-10-CM | POA: Diagnosis not present

## 2023-12-30 DIAGNOSIS — O0940 Supervision of pregnancy with grand multiparity, unspecified trimester: Secondary | ICD-10-CM

## 2023-12-30 DIAGNOSIS — F1290 Cannabis use, unspecified, uncomplicated: Secondary | ICD-10-CM | POA: Diagnosis present

## 2023-12-30 DIAGNOSIS — O99214 Obesity complicating childbirth: Secondary | ICD-10-CM | POA: Diagnosis present

## 2023-12-30 DIAGNOSIS — Z3A40 40 weeks gestation of pregnancy: Secondary | ICD-10-CM | POA: Diagnosis not present

## 2023-12-30 DIAGNOSIS — O99324 Drug use complicating childbirth: Secondary | ICD-10-CM | POA: Diagnosis present

## 2023-12-30 DIAGNOSIS — F32A Depression, unspecified: Secondary | ICD-10-CM | POA: Diagnosis not present

## 2023-12-30 DIAGNOSIS — K219 Gastro-esophageal reflux disease without esophagitis: Secondary | ICD-10-CM | POA: Diagnosis present

## 2023-12-30 DIAGNOSIS — O99344 Other mental disorders complicating childbirth: Secondary | ICD-10-CM | POA: Diagnosis present

## 2023-12-30 DIAGNOSIS — O9902 Anemia complicating childbirth: Secondary | ICD-10-CM | POA: Diagnosis present

## 2023-12-30 DIAGNOSIS — O4202 Full-term premature rupture of membranes, onset of labor within 24 hours of rupture: Principal | ICD-10-CM

## 2023-12-30 DIAGNOSIS — O360130 Maternal care for anti-D [Rh] antibodies, third trimester, not applicable or unspecified: Secondary | ICD-10-CM | POA: Diagnosis not present

## 2023-12-30 DIAGNOSIS — O0943 Supervision of pregnancy with grand multiparity, third trimester: Secondary | ICD-10-CM | POA: Diagnosis not present

## 2023-12-30 DIAGNOSIS — F419 Anxiety disorder, unspecified: Secondary | ICD-10-CM | POA: Diagnosis present

## 2023-12-30 DIAGNOSIS — O9962 Diseases of the digestive system complicating childbirth: Secondary | ICD-10-CM | POA: Diagnosis present

## 2023-12-30 DIAGNOSIS — O4292 Full-term premature rupture of membranes, unspecified as to length of time between rupture and onset of labor: Secondary | ICD-10-CM

## 2023-12-30 DIAGNOSIS — Z3490 Encounter for supervision of normal pregnancy, unspecified, unspecified trimester: Secondary | ICD-10-CM | POA: Diagnosis present

## 2023-12-30 DIAGNOSIS — O26899 Other specified pregnancy related conditions, unspecified trimester: Secondary | ICD-10-CM

## 2023-12-30 DIAGNOSIS — Z8249 Family history of ischemic heart disease and other diseases of the circulatory system: Secondary | ICD-10-CM

## 2023-12-30 LAB — TYPE AND SCREEN
ABO/RH(D): O NEG
Antibody Screen: POSITIVE

## 2023-12-30 LAB — CBC
HCT: 33.4 % — ABNORMAL LOW (ref 36.0–46.0)
Hemoglobin: 10.8 g/dL — ABNORMAL LOW (ref 12.0–15.0)
MCH: 27.3 pg (ref 26.0–34.0)
MCHC: 32.3 g/dL (ref 30.0–36.0)
MCV: 84.6 fL (ref 80.0–100.0)
Platelets: 201 K/uL (ref 150–400)
RBC: 3.95 MIL/uL (ref 3.87–5.11)
RDW: 13.3 % (ref 11.5–15.5)
WBC: 9.5 K/uL (ref 4.0–10.5)
nRBC: 0 % (ref 0.0–0.2)

## 2023-12-30 MED ORDER — AMMONIA AROMATIC IN INHA
RESPIRATORY_TRACT | Status: AC
  Filled 2023-12-30: qty 10

## 2023-12-30 MED ORDER — LACTATED RINGERS IV SOLN
500.0000 mL | Freq: Once | INTRAVENOUS | Status: AC
  Administered 2023-12-30: 500 mL via INTRAVENOUS

## 2023-12-30 MED ORDER — LIDOCAINE HCL (PF) 1 % IJ SOLN: 30.0000 mL | INTRAMUSCULAR | Status: DC | PRN

## 2023-12-30 MED ORDER — MISOPROSTOL 25 MCG QUARTER TABLET
25.0000 ug | ORAL_TABLET | Freq: Once | ORAL | Status: DC
Start: 2023-12-30 — End: 2023-12-30

## 2023-12-30 MED ORDER — LIDOCAINE HCL (PF) 1 % IJ SOLN
INTRAMUSCULAR | Status: AC
  Filled 2023-12-30: qty 30

## 2023-12-30 MED ORDER — DIPHENHYDRAMINE HCL 50 MG/ML IJ SOLN: 12.5000 mg | INTRAMUSCULAR | Status: DC | PRN

## 2023-12-30 MED ORDER — OXYTOCIN-SODIUM CHLORIDE 30-0.9 UT/500ML-% IV SOLN: 2.5000 [IU]/h | INTRAVENOUS | Status: DC

## 2023-12-30 MED ORDER — EPHEDRINE 5 MG/ML INJ: 10.0000 mg | INTRAVENOUS | Status: DC | PRN

## 2023-12-30 MED ORDER — FENTANYL-BUPIVACAINE-NACL 0.5-0.125-0.9 MG/250ML-% EP SOLN
12.0000 mL/h | EPIDURAL | Status: DC | PRN
  Administered 2023-12-30: 12 mL/h via EPIDURAL
  Filled 2023-12-30: qty 250

## 2023-12-30 MED ORDER — LIDOCAINE-EPINEPHRINE (PF) 1.5 %-1:200000 IJ SOLN
INTRAMUSCULAR | Status: DC | PRN
  Administered 2023-12-30: 3 mL via EPIDURAL

## 2023-12-30 MED ORDER — OXYTOCIN 10 UNIT/ML IJ SOLN
INTRAMUSCULAR | Status: AC
  Filled 2023-12-30: qty 2

## 2023-12-30 MED ORDER — METHYLERGONOVINE MALEATE 0.2 MG/ML IJ SOLN
INTRAMUSCULAR | Status: AC
  Filled 2023-12-30: qty 1

## 2023-12-30 MED ORDER — SOD CITRATE-CITRIC ACID 500-334 MG/5ML PO SOLN: 30.0000 mL | ORAL | Status: DC | PRN

## 2023-12-30 MED ORDER — PHENYLEPHRINE 80 MCG/ML (10ML) SYRINGE FOR IV PUSH (FOR BLOOD PRESSURE SUPPORT): 80.0000 ug | PREFILLED_SYRINGE | INTRAVENOUS | Status: DC | PRN

## 2023-12-30 MED ORDER — ONDANSETRON HCL 4 MG/2ML IJ SOLN
4.0000 mg | Freq: Four times a day (QID) | INTRAMUSCULAR | Status: DC | PRN
  Administered 2023-12-30: 4 mg via INTRAVENOUS
  Filled 2023-12-30: qty 2

## 2023-12-30 MED ORDER — LACTATED RINGERS IV SOLN: 500.0000 mL | INTRAVENOUS | Status: DC | PRN

## 2023-12-30 MED ORDER — MISOPROSTOL 50MCG HALF TABLET
50.0000 ug | ORAL_TABLET | ORAL | Status: DC | PRN
Start: 2023-12-30 — End: 2023-12-30

## 2023-12-30 MED ORDER — OXYTOCIN BOLUS FROM INFUSION
333.0000 mL | Freq: Once | INTRAVENOUS | Status: AC
  Administered 2023-12-31: 333 mL via INTRAVENOUS

## 2023-12-30 MED ORDER — MISOPROSTOL 200 MCG PO TABS
ORAL_TABLET | ORAL | Status: AC
  Filled 2023-12-30: qty 4

## 2023-12-30 MED ORDER — OXYTOCIN-SODIUM CHLORIDE 30-0.9 UT/500ML-% IV SOLN
1.0000 m[IU]/min | INTRAVENOUS | Status: DC
  Administered 2023-12-31: 2 m[IU]/min via INTRAVENOUS
  Filled 2023-12-30: qty 500

## 2023-12-30 MED ORDER — FENTANYL CITRATE (PF) 100 MCG/2ML IJ SOLN: 50.0000 ug | INTRAMUSCULAR | Status: DC | PRN

## 2023-12-30 MED ORDER — CARBOPROST TROMETHAMINE 250 MCG/ML IM SOLN
INTRAMUSCULAR | Status: AC
  Filled 2023-12-30: qty 1

## 2023-12-30 MED ORDER — LACTATED RINGERS IV SOLN
INTRAVENOUS | Status: DC
  Administered 2023-12-30: 500 mL via INTRAVENOUS

## 2023-12-30 MED ORDER — BUPIVACAINE HCL (PF) 0.25 % IJ SOLN
INTRAMUSCULAR | Status: DC | PRN
  Administered 2023-12-30: 8 mL via EPIDURAL

## 2023-12-30 MED ORDER — TERBUTALINE SULFATE 1 MG/ML IJ SOLN: 0.2500 mg | Freq: Once | INTRAMUSCULAR | Status: DC | PRN

## 2023-12-30 MED ORDER — HYDROXYZINE HCL 25 MG PO TABS
25.0000 mg | ORAL_TABLET | Freq: Four times a day (QID) | ORAL | Status: DC | PRN
  Administered 2023-12-30: 25 mg via ORAL
  Filled 2023-12-30: qty 1

## 2023-12-30 NOTE — Anesthesia Preprocedure Evaluation (Signed)
 Anesthesia Evaluation  Patient identified by MRN, date of birth, ID band Patient awake    Reviewed: Allergy & Precautions, NPO status , Patient's Chart, lab work & pertinent test results  History of Anesthesia Complications Negative for: history of anesthetic complications  Airway Mallampati: III   Neck ROM: Full    Dental   Pulmonary former smoker (hx marijuana use)   Pulmonary exam normal breath sounds clear to auscultation       Cardiovascular Exercise Tolerance: Good negative cardio ROS Normal cardiovascular exam Rhythm:Regular Rate:Normal     Neuro/Psych  PSYCHIATRIC DISORDERS Anxiety     negative neurological ROS     GI/Hepatic ,GERD  ,,  Endo/Other  Obesity   Renal/GU negative Renal ROS     Musculoskeletal   Abdominal   Peds  Hematology  (+) Blood dyscrasia, anemia   Anesthesia Other Findings 33 yo H1E3983 at 15 1/7 requesting labor epidural.  Reproductive/Obstetrics (+) Pregnancy                              Anesthesia Physical Anesthesia Plan  ASA: 2  Anesthesia Plan: Epidural   Post-op Pain Management:    Induction:   PONV Risk Score and Plan: 2 and Treatment may vary due to age or medical condition  Airway Management Planned: Natural Airway  Additional Equipment:   Intra-op Plan:   Post-operative Plan:   Informed Consent: I have reviewed the patients History and Physical, chart, labs and discussed the procedure including the risks, benefits and alternatives for the proposed anesthesia with the patient or authorized representative who has indicated his/her understanding and acceptance.     Dental Advisory Given  Plan Discussed with:   Anesthesia Plan Comments: (Patient reports no bleeding problems and no anticoagulant use.   Patient consented for risks of anesthesia including but not limited to:  - adverse reactions to medications - risk of bleeding,  infection and or nerve damage from epidural that could lead to paralysis - risk of headache or failed epidural - nerve damage due to positioning - that if epidural is used for C-section that there is a chance of epidural failure requiring spinal placement or conversion to GA - damage to heart, brain, lungs, other parts of body or loss of life  Patient voiced understanding and assent.)        Anesthesia Quick Evaluation

## 2023-12-30 NOTE — H&P (Signed)
 [facility] Labor & Delivery  History and Physical   HPI   Chief Complaint: leaking fluid   Mary [name] is a 33 y.o. [phone] at [redacted]w[redacted]d who presents for leaking of clear fluid that began around 500pm, she has had multiple episodes of small gushes of fluid. She denies vaginal bleeding. Endorses some pressure/tightening but no painful contractions. Endorses fetal movement.    Pregnancy Complications Patient Active Problem List   Diagnosis Date Noted   Premature rupture of membranes 12/30/2023   Encounter for induction of labor 12/30/2023   Anxiety during pregnancy 11/03/2023   Family history of first degree relative with congenital heart disease 08/20/2017   Marijuana use 07/30/2017   Supervision of other normal pregnancy, antepartum 05/14/2017   Rh negative state in antepartum period 05/14/2017    Review of Systems A twelve point review of systems was negative except as stated in HPI.   HISTORY   Medications Medications Prior to Admission  Medication Sig Dispense Refill Last Dose/Taking   escitalopram  (LEXAPRO ) 10 MG tablet Take 1 tablet (10 mg total) by mouth daily. Take 5mg , 1/2 tablet daily for 4 days, then increase to 10mg , whole tablet daily. 30 tablet 1 12/29/2023   hydrOXYzine  (ATARAX ) 25 MG tablet Take 1 tablet (25 mg total) by mouth every 6 (six) hours as needed for anxiety (or insomnia). 30 tablet 2 12/29/2023   Prenatal Vit-Fe Phos-FA-Omega (VITAFOL  GUMMIES) 3.33-0.333-34.8 MG CHEW Chew 1 tablet by mouth daily. 34 tablet 3 12/30/2023   famotidine  (PEPCID ) 20 MG tablet Take 1 tablet (20 mg total) by mouth 2 (two) times daily. (Patient not taking: Reported on 12/30/2023) 60 tablet 3 Not Taking   ondansetron  (ZOFRAN -ODT) 4 MG disintegrating tablet Take 1 tablet (4 mg total) by mouth every 8 (eight) hours as needed for nausea or vomiting. 30 tablet 6    PRENATAL 28-0.8 MG TABS Take by mouth. (Patient not taking: Reported on 12/30/2023)   Not  Taking    Allergies has no known allergies.   OB History OB History  Gravida Para Term Preterm AB Living  8 6 6  0 1 6  SAB IAB Ectopic Multiple Live Births  1 0 0 0 6    # Outcome Date GA Lbr Len/2nd Weight Sex Type Anes PTL Lv  8 Current           7 Term 09/23/21 [redacted]w[redacted]d 03:04 / 00:17 3010 g M Vag-Spont EPI  LIV     Name: Mary [name]     Apgar1: 9  Apgar5: 9  6 Term 10/13/17 [redacted]w[redacted]d 08:18 / 00:12 3180 g M Vag-Spont EPI  LIV     Name: Mary [name]     Apgar1: 8  Apgar5: 9  5 Term 05/16/15 [redacted]w[redacted]d 03:15 / 00:27 3520 g M Vag-Spont EPI  LIV     Name: Mary [name]     Apgar1: 8  Apgar5: 9  4 Term 11/17/13 [redacted]w[redacted]d  3827 g Mary [name]   LIV     Name: Mary [name]  3 Term 07/24/11 [redacted]w[redacted]d  3685 g F Vag-Spont   LIV  2 Term 07/16/09 [redacted]w[redacted]d  3232 g F Vag-Spont   LIV     Name: Mary [name]  1 SAB 2009 [redacted]w[redacted]d           Past Medical History Past Medical History:  Diagnosis Date   Anemia affecting pregnancy    Gallstones    GERD (gastroesophageal reflux disease)     Past Surgical History  Past Surgical History:  Procedure Laterality Date   APPENDECTOMY     LAPAROSCOPIC CHOLECYSTECTOMY  10/24/2014   [redacted] wks pregnant    Social History  reports that she quit smoking about 6 years ago. Her smoking use included cigarettes. She has been exposed to tobacco smoke. She has never used smokeless tobacco. She reports current drug use. Drug: Marijuana. She reports that she does not drink alcohol.   Family History family history includes Diabetes in her mother; HIV in her father; Hypertension in her father and mother; Lupus in her mother.   PHYSICAL EXAM   Vitals:   12/30/23 1738  Resp: 20  Temp: 98 F (36.7 C)  TempSrc: Oral  Weight: 73.9 kg  Height: 4' 9 (1.448 m)    Constitutional: No acute distress, well appearing, and well nourished. Neurologic: She is alert and conversational.  Psychiatric: She has a normal mood and affect.  Musculoskeletal:  Normal gait, grossly normal range of motion Cardiovascular: Normal rate.   Pulmonary/Chest: Normal work of breathing.  Gastrointestinal/Abdominal: Soft. Gravid. There is no tenderness.  Skin: Skin is warm and dry. No rash noted.  Genitourinary: Normal external female genitalia.  SVE:   Dilation: 4 Effacement (%): 60 Station: -2 Presentation: Vertex Exam by:: Mary [name]   NST Interpretation Indication: contractions  Baseline: 150 bpm Variability: moderate Accelerations: present Decelerations: absent Contractions: regular, every 2-5 minutes Time noted:  See OBIX Impression: reactive Authenticated by: Mary [name]    PRENATAL LABS FROM OB RESULTS CONSOLE  ABO, Rh: --/--/PENDING (09/17 1846) Antibody: PENDING (09/17 1846) Rubella: 1.51 (03/14 1111) Varicella: Reactive (03/14 1111) RPR: Non Reactive (07/07 1123)  HBsAg: Negative (03/14 1111)  HC No results found for: HCVAB HIV: Non Reactive (07/07 1123)  GC:  Lab Results  Component Value Date   NGONORRHOEAE NOT DETECTED 05/16/2015   CT:  Lab Results  Component Value Date   CHLAMYDIA Negative 05/14/2017   1h GTT:  Lab Results  Component Value Date   GLUCOSE 91 05/06/2017   3h GTT:   GBS: Negative/-- (08/21 1103)  ENCOUNTER LABS    Results for orders placed or performed during the hospital encounter of 12/30/23 (from the past 24 hours)  CBC     Status: Abnormal   Collection Time: 12/30/23  6:46 PM  Result Value Ref Range   WBC 9.5 4.0 - 10.5 K/uL   RBC 3.95 3.87 - 5.11 MIL/uL   Hemoglobin 10.8 (L) 12.0 - 15.0 g/dL   HCT 66.5 (L) 63.9 - 53.9 %   MCV 84.6 80.0 - 100.0 fL   MCH 27.3 26.0 - 34.0 pg   MCHC 32.3 30.0 - 36.0 g/dL   RDW 86.6 88.4 - 84.4 %   Platelets 201 150 - 400 K/uL   nRBC 0.0 0.0 - 0.2 %  Type and screen     Status: None (Preliminary result)   Collection Time: 12/30/23  6:46 PM  Result Value Ref Range   ABO/RH(D) PENDING    Antibody Screen PENDING    Sample Expiration       01/02/2024,2359 Performed at [facility], [address], [place], [place] [address]     ASSESSMENT AND PLAN   Mary [name] is a 33 y.o. H1E3983 at [redacted]w[redacted]d with EDD: 12/29/2023, by Ultrasound admitted for rupture of membranes.   Expectant management  Fetal Status: - cephalic presentation by Leopold's maneuvers and SVE - EFW: 7lb by Leopold's - CEFM after epidural - FHR currently category 1  GBS:  negative Not indicated  Pain management: - CLE  Anxiety/depression in pregnancy - continue lexapro  10mg  daily  Marijuana use in pregnancy - UDS on admission - TOC consult after delivery  Rh Negative - Rhogam 7/7 - Collect cord blood at delivery for typing - rhogam prn postpartum   Labs/Immunizations: TDAP: Given prenatally. Flu: Given prenatally RSV: No Rubella: Immune Varicella: Immune    Postpartum Plan: - Feeding: Breast Milk and Formula - Contraception: plans Nexplanon  - Prenatal Care Provider: AOB  Attending Dr. Leigh was immediately available for the care of the patient.

## 2023-12-30 NOTE — Anesthesia Procedure Notes (Signed)
 Epidural Patient location during procedure: OB Start time: 12/30/2023 8:08 PM End time: 12/30/2023 8:14 PM  Staffing Anesthesiologist: Shellie Odor, MD Performed: anesthesiologist   Preanesthetic Checklist Completed: patient identified, IV checked, risks and benefits discussed, surgical consent, monitors and equipment checked, pre-op evaluation and timeout performed  Epidural Patient position: sitting Prep: ChloraPrep Patient monitoring: heart rate, continuous pulse ox and blood pressure Approach: midline Location: L2-L3 Injection technique: LOR air  Needle:  Needle insertion depth: 4.5 cm Needle type: Tuohy  Needle gauge: 17 G Needle length: 9 cm Needle insertion depth: 4.5 cm Catheter at skin depth: 9.5 cm Test dose: negative and 1.5% lidocaine  with Epi 1:200 K  Assessment Sensory level: T4  Additional Notes Straightforward placement without apparent complications. Reason for block:procedure for pain

## 2023-12-31 DIAGNOSIS — O99344 Other mental disorders complicating childbirth: Secondary | ICD-10-CM

## 2023-12-31 DIAGNOSIS — O0943 Supervision of pregnancy with grand multiparity, third trimester: Secondary | ICD-10-CM

## 2023-12-31 DIAGNOSIS — F419 Anxiety disorder, unspecified: Secondary | ICD-10-CM

## 2023-12-31 DIAGNOSIS — F32A Depression, unspecified: Secondary | ICD-10-CM

## 2023-12-31 DIAGNOSIS — F1290 Cannabis use, unspecified, uncomplicated: Secondary | ICD-10-CM

## 2023-12-31 DIAGNOSIS — O99324 Drug use complicating childbirth: Secondary | ICD-10-CM

## 2023-12-31 DIAGNOSIS — O0940 Supervision of pregnancy with grand multiparity, unspecified trimester: Secondary | ICD-10-CM

## 2023-12-31 DIAGNOSIS — O360130 Maternal care for anti-D [Rh] antibodies, third trimester, not applicable or unspecified: Secondary | ICD-10-CM

## 2023-12-31 LAB — URINE DRUG SCREEN, QUALITATIVE (ARMC ONLY)
Amphetamines, Ur Screen: NOT DETECTED
Barbiturates, Ur Screen: NOT DETECTED
Benzodiazepine, Ur Scrn: NOT DETECTED
Cannabinoid 50 Ng, Ur ~~LOC~~: POSITIVE — AB
Cocaine Metabolite,Ur ~~LOC~~: NOT DETECTED
MDMA (Ecstasy)Ur Screen: NOT DETECTED
Methadone Scn, Ur: NOT DETECTED
Opiate, Ur Screen: NOT DETECTED
Phencyclidine (PCP) Ur S: NOT DETECTED
Tricyclic, Ur Screen: NOT DETECTED

## 2023-12-31 LAB — CBC
HCT: 30.5 % — ABNORMAL LOW (ref 36.0–46.0)
Hemoglobin: 9.7 g/dL — ABNORMAL LOW (ref 12.0–15.0)
MCH: 26.8 pg (ref 26.0–34.0)
MCHC: 31.8 g/dL (ref 30.0–36.0)
MCV: 84.3 fL (ref 80.0–100.0)
Platelets: 181 K/uL (ref 150–400)
RBC: 3.62 MIL/uL — ABNORMAL LOW (ref 3.87–5.11)
RDW: 13.3 % (ref 11.5–15.5)
WBC: 11.6 K/uL — ABNORMAL HIGH (ref 4.0–10.5)
nRBC: 0 % (ref 0.0–0.2)

## 2023-12-31 LAB — RPR: RPR Ser Ql: NONREACTIVE

## 2023-12-31 MED ORDER — HYDROXYZINE HCL 25 MG PO TABS: 25.0000 mg | ORAL_TABLET | Freq: Four times a day (QID) | ORAL | Status: DC | PRN

## 2023-12-31 MED ORDER — DIBUCAINE (PERIANAL) 1 % EX OINT: 1.0000 | TOPICAL_OINTMENT | CUTANEOUS | Status: DC | PRN

## 2023-12-31 MED ORDER — ACETAMINOPHEN 500 MG PO TABS
1000.0000 mg | ORAL_TABLET | Freq: Four times a day (QID) | ORAL | Status: DC | PRN
  Administered 2023-12-31: 1000 mg via ORAL
  Filled 2023-12-31: qty 2

## 2023-12-31 MED ORDER — ETONOGESTREL 68 MG ~~LOC~~ IMPL
68.0000 mg | DRUG_IMPLANT | Freq: Once | SUBCUTANEOUS | Status: AC
  Administered 2024-01-01: 68 mg via SUBCUTANEOUS
  Filled 2023-12-31 (×2): qty 1

## 2023-12-31 MED ORDER — COCONUT OIL OIL: 1.0000 | TOPICAL_OIL | Status: DC | PRN

## 2023-12-31 MED ORDER — TETANUS-DIPHTH-ACELL PERTUSSIS 5-2.5-18.5 LF-MCG/0.5 IM SUSY
0.5000 mL | PREFILLED_SYRINGE | Freq: Once | INTRAMUSCULAR | Status: DC
  Filled 2023-12-31: qty 0.5

## 2023-12-31 MED ORDER — DOCUSATE SODIUM 100 MG PO CAPS
100.0000 mg | ORAL_CAPSULE | Freq: Two times a day (BID) | ORAL | Status: DC
  Administered 2024-01-01: 100 mg via ORAL
  Filled 2023-12-31: qty 1

## 2023-12-31 MED ORDER — ONDANSETRON HCL 4 MG PO TABS: 4.0000 mg | ORAL_TABLET | ORAL | Status: DC | PRN

## 2023-12-31 MED ORDER — WITCH HAZEL-GLYCERIN EX PADS: 1.0000 | MEDICATED_PAD | CUTANEOUS | Status: DC | PRN

## 2023-12-31 MED ORDER — LIDOCAINE HCL 1 % IJ SOLN
0.0000 mL | Freq: Once | INTRAMUSCULAR | Status: DC | PRN
  Filled 2023-12-31: qty 10
  Filled 2023-12-31: qty 20
  Filled 2023-12-31: qty 10

## 2023-12-31 MED ORDER — IBUPROFEN 600 MG PO TABS
600.0000 mg | ORAL_TABLET | Freq: Four times a day (QID) | ORAL | Status: DC
  Administered 2023-12-31 – 2024-01-01 (×5): 600 mg via ORAL
  Filled 2023-12-31 (×5): qty 1

## 2023-12-31 MED ORDER — PRENATAL MULTIVITAMIN CH
1.0000 | ORAL_TABLET | Freq: Every day | ORAL | Status: DC
  Administered 2023-12-31: 1 via ORAL
  Filled 2023-12-31: qty 1

## 2023-12-31 MED ORDER — BENZOCAINE-MENTHOL 20-0.5 % EX AERO: 1.0000 | INHALATION_SPRAY | CUTANEOUS | Status: DC | PRN

## 2023-12-31 MED ORDER — DIPHENHYDRAMINE HCL 25 MG PO CAPS: 25.0000 mg | ORAL_CAPSULE | Freq: Four times a day (QID) | ORAL | Status: DC | PRN

## 2023-12-31 MED ORDER — OXYCODONE HCL 5 MG PO TABS
5.0000 mg | ORAL_TABLET | Freq: Four times a day (QID) | ORAL | Status: DC | PRN
  Administered 2023-12-31 – 2024-01-01 (×2): 5 mg via ORAL
  Filled 2023-12-31 (×2): qty 1

## 2023-12-31 MED ORDER — SIMETHICONE 80 MG PO CHEW: 80.0000 mg | CHEWABLE_TABLET | ORAL | Status: DC | PRN

## 2023-12-31 MED ORDER — ONDANSETRON HCL 4 MG/2ML IJ SOLN: 4.0000 mg | INTRAMUSCULAR | Status: DC | PRN

## 2023-12-31 NOTE — Progress Notes (Signed)
 Labor Progress Note   ASSESSMENT/PLAN   Mary [name] 33 y.o.   H1E3983  at [redacted]w[redacted]d admitted for PROM, now active labor.  FWB:  - Fetal well being assessed: Category 2       GBS: - GBS: negative - Prophylaxis: Not indicated  LABOR: - Now in active labor, doing well. - Pain Management: CLE - Discussed options with patient and recheck in 1h, reduce epidural rate. Begin pushing with increased pressure/urge to push sooner as needed - Anticipate SVD   Principal Problem:   Encounter for induction of labor Active Problems:   Premature rupture of membranes   SUBJECTIVE/OBJECTIVE   SUBJECTIVE: Resting comfortably with epidural    OBJECTIVE: Vital Signs: Patient Vitals for the past 12 hrs:  BP Temp Temp src Pulse Resp SpO2 Height Weight  12/31/23 0210 (!) 105/53 -- -- 71 -- 100 % -- --  12/31/23 0200 -- 98.3 F (36.8 C) Oral -- -- -- -- --  12/31/23 0150 (!) 99/57 -- -- 67 -- 100 % -- --  12/31/23 0050 (!) 97/45 -- -- 74 -- 100 % -- --  12/31/23 0020 -- -- -- -- -- 100 % -- --  12/31/23 0015 -- -- -- -- -- 100 % -- --  12/31/23 0010 -- -- -- -- -- 100 % -- --  12/31/23 0000 -- 98.2 F (36.8 C) Oral -- -- -- -- --  12/30/23 2350 (!) 90/52 -- -- 85 -- 98 % -- --  12/30/23 2250 (!) 99/58 -- -- 77 -- 99 % -- --  12/30/23 2240 -- -- -- -- -- 99 % -- --  12/30/23 2200 -- 98.5 F (36.9 C) Oral -- -- -- -- --  12/30/23 2150 93/60 -- -- 87 -- 100 % -- --  12/30/23 2136 101/60 -- -- 90 -- -- -- --  12/30/23 2121 103/64 -- -- 87 -- -- -- --  12/30/23 2059 115/75 -- -- 93 -- -- -- --  12/30/23 2051 100/62 -- -- 100 -- -- -- --  12/30/23 2036 99/66 -- -- (!) 112 -- -- -- --  12/30/23 2031 107/64 -- -- (!) 113 -- -- -- --  12/30/23 2026 114/69 -- -- 95 -- -- -- --  12/30/23 2025 114/69 -- -- 95 -- 100 % -- --  12/30/23 2020 114/75 -- -- 95 -- 100 % -- --  12/30/23 2016 124/89 -- -- 88 -- -- -- --  12/30/23 2015 -- -- -- -- -- 100 % -- --  12/30/23 2014 117/86 -- -- 95 --  -- -- --  12/30/23 2000 -- 97.8 F (36.6 C) Oral -- -- -- -- --  12/30/23 1738 -- 98 F (36.7 C) Oral -- 20 -- 4' 9 (1.448 m) 73.9 kg    Last SVE:  Dilation: Lip/rim Effacement (%): 90 Station: 0 Presentation: Vertex Exam by:: J. Tashala Cumbo,CNM -  , Rupture Date: 12/30/23, Rupture Time: 1700,    FHR:   - Mode: External  - Baseline Rate (A): 145 bpm  -  moderate variability  - Characteristics (ie - accels, decels): accels present, variable & early decels present  -    UTERINE ACTIVITY:   - Mode: Toco, Palpation  - Contraction Frequency (min): 2-5 minutes  Pitocin @8mu /min

## 2023-12-31 NOTE — Progress Notes (Signed)
 Labor Progress Note   ASSESSMENT/PLAN   Mary [name] 32 y.o.   [id number]  at [redacted]w[redacted]d here for PROM.  FWB:  - Fetal well being assessed: Category 2       GBS: - GBS: negative - Prophylaxis: Not indicated  LABOR: - Now in early labor, doing well. - Pain Management: plans epidural - Discussed options with patient and will start pitocin  at midnieght if no significant cervical change - Anticipate SVD   Principal Problem:   Encounter for induction of labor Active Problems:   Premature rupture of membranes   SUBJECTIVE/OBJECTIVE   SUBJECTIVE: Getting relief from epidural, contractions becoming painful prior to placement    OBJECTIVE: Vital Signs: Patient Vitals for the past 12 hrs:  BP Temp Temp src Pulse Resp SpO2 Height Weight  12/30/23 2136 101/60 -- -- 90 -- -- -- --  12/30/23 2121 103/64 -- -- 87 -- -- -- --  12/30/23 2059 115/75 -- -- 93 -- -- -- --  12/30/23 2051 100/62 -- -- 100 -- -- -- --  12/30/23 2036 99/66 -- -- (!) 112 -- -- -- --  12/30/23 2031 107/64 -- -- (!) 113 -- -- -- --  12/30/23 2026 114/69 -- -- 95 -- -- -- --  12/30/23 2025 114/69 -- -- 95 -- 100 % -- --  12/30/23 2020 114/75 -- -- 95 -- 100 % -- --  12/30/23 2016 124/89 -- -- 88 -- -- -- --  12/30/23 2015 -- -- -- -- -- 100 % -- --  12/30/23 2014 117/86 -- -- 95 -- -- -- --  12/30/23 2000 -- 97.8 F (36.6 C) Oral -- -- -- -- --  12/30/23 1738 -- 98 F (36.7 C) Oral -- 20 -- 4' 9 (1.448 m) 73.9 kg    Last SVE:  Dilation: 4 Effacement (%): 70 Station: -2 Presentation: Vertex Exam by:: J. Joseangel Nettleton,CNM -  , Rupture Date: 12/30/23, Rupture Time: 1700,    FHR:   - Mode: External  - Baseline Rate (A): 150 bpm  -  Moderate variability  - Characteristics (ie - accels, decels): accels present, decels present-late x1, variable x1 since epidural placement  -    UTERINE ACTIVITY:   - Mode: Toco, Palpation  - Contraction Frequency (min): 2-5 minutes

## 2023-12-31 NOTE — Discharge Summary (Signed)
 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 yo H1E3983 at [redacted]w[redacted]d was admitted in Latent Labor on 12/30/2023. Labor course was complicated by none  Membrane Rupture Time/Date: 5:00 PM,12/30/2023  Delivery Method:Vaginal, Spontaneous Operative Delivery:N/A Episiotomy: None Lacerations:  None Patient had a postpartum course complicated by ***.  She is ambulating, tolerating a regular diet, passing flatus, and urinating well. Patient is discharged home in stable condition on 12/31/23.  Newborn Data: Birth date:12/31/2023 Birth time:4:34 AM Gender:Female Living status:  Apgars: ,  Weight:   Magnesium Sulfate received: No BMZ received: No Rhophylac :{Rhophylac  received:30440032} MMR:N/A T-DaP:Given prenatally Flu: Given prenatally RSV Vaccine received: No Transfusion:{Transfusion received:30440034} Immunizations administered: Immunization History  Administered Date(s) Administered   Influenza, Seasonal, Injecte, Preservative Fre 12/18/2023   Influenza,inj,Quad PF,6+ Mos 06/10/2017   Tdap 10/14/2017, 10/19/2023    Physical exam  Vitals:   12/31/23 0320  12/31/23 0350 12/31/23 0420 12/31/23 0440  BP: 113/78 104/64 108/76 100/68  Pulse: 78 79 84 (!) 102  Resp:      Temp:      TempSrc:      SpO2: 100% 100% 100% 100%  Weight:      Height:       General: {Exam; general:21111117} Lochia: {Desc; appropriate/inappropriate:30686::appropriate} Uterine Fundus: {Desc; firm/soft:30687} Incision: {Exam; incision:21111123} DVT Evaluation: {Exam; dvt:2111122} Labs: Lab Results  Component Value Date   WBC 9.5 12/30/2023   HGB 10.8 (L) 12/30/2023   HCT 33.4 (L) 12/30/2023   MCV 84.6 12/30/2023   PLT 201 12/30/2023      Latest Ref Rng & Units 05/06/2017    9:25 PM  CMP  Glucose 65 - 99 mg/dL 91   BUN 6 - 20 mg/dL 8   Creatinine 9.55 - 8.99 mg/dL 9.43   Sodium 864 - 854 mmol/L 136   Potassium 3.5 - 5.1 mmol/L 3.3   Chloride 101 - 111 mmol/L 105   CO2 22 - 32 mmol/L 22   Calcium 8.9 - 10.3 mg/dL 8.5    Edinburgh Score:    11/11/2023    4:09 PM  Edinburgh Postnatal Depression Scale Screening Tool  I have been able to laugh and see the funny side of things. 1  I have looked forward with enjoyment to things. 1  I have blamed myself unnecessarily when things went wrong. 1  I have been anxious or worried for no good reason. 2  I have felt scared or panicky for no good reason. 1  Things have been getting on top of me. 1  I have been so unhappy that I  have had difficulty sleeping. 1  I have felt sad or miserable. 1  I have been so unhappy that I have been crying. 1  The thought of harming myself has occurred to me. 0  Edinburgh Postnatal Depression Scale Total 10      After visit meds:  Allergies as of 12/31/2023   No Known Allergies   Med Rec must be completed prior to using this [facility]***        Discharge home in stable condition Infant Feeding: {Baby feeding:23562} Infant Disposition:{CHL IP OB HOME WITH FNUYZM:76418} Discharge instruction: per After Visit Summary and Postpartum booklet. Activity: Advance as  tolerated. Pelvic rest for 6 weeks.  Diet: {OB diet:21111121} Anticipated Birth Control: {Birth Control:23956} Postpartum Appointment:{Outpatient follow up:23559} Additional Postpartum F/U: {PP Procedure:23957} Future Appointments: Future Appointments  Date Time Provider Department Center  01/04/2024  3:15 PM [facility] ROOM [facility] None  01/04/2024  3:35 PM Justino Eleanor HERO, CNM [facility] None   Follow up Visit:      12/31/2023 Harlene LITTIE Cisco, CNM

## 2023-12-31 NOTE — Lactation Note (Signed)
 This note was copied from a baby's chart. Lactation Consultation Note  Patient Name: Boy Stevey Averyana Pillars Unijb'd Date: 12/31/2023 Age:31 hours Reason for consult: Initial assessment;Term   Maternal Data Has patient been taught Hand Expression?: Yes Does the patient have breastfeeding experience prior to this delivery?: Yes How long did the patient breastfeed?: 4-5 months  Initial assessment w/ a 11hr old baby boy and P7 mom.  This was a SVD.  Patient w/ a hx of anxiety on Lexapro .  Mom stated feedings are going good she is just having pain in her stomach.  She is also interested in getting a breastpump.  Feeding Mother's Current Feeding Choice: Breast Milk Nipple Type: Slow - flow  Lactation Tools Discussed/Used Provided patient w/ a Medela Harmony Pump.  Mom already knew how to use it.   Interventions Interventions: Breast feeding basics reviewed;Hand pump  LC provided education on the following;  milk production expectations, hunger cues, day 1/2 wet/dirty diapers, hand expression, cluster feeding, benefits of STS and arousing infant for a feeding.  Lactation informed patient of feeding infant at least 8 or more times w/in a 24hr period but not exceeding 3hrs. Patient verbalized understanding.   LC informed mom why her uterus was cramping when she is breastfeeding.  Mom wowed over the knowledge.  Patient also provided a Medicaid Breastfeeding Support form so she is able to order a breastpump.  Discharge Discharge Education: Outpatient recommendation Pump: Advised to call insurance company [facility] Program: Yes  Consult Status Consult Status: PRN    Caliegh Middlekauff S Jeanine Caven 12/31/2023, 4:12 PM

## 2024-01-01 ENCOUNTER — Other Ambulatory Visit: Payer: Self-pay

## 2024-01-01 LAB — FETAL SCREEN: Fetal Screen: NEGATIVE

## 2024-01-01 MED ORDER — RHO D IMMUNE GLOBULIN 1500 UNIT/2ML IJ SOSY
300.0000 ug | PREFILLED_SYRINGE | Freq: Once | INTRAMUSCULAR | Status: AC
  Administered 2024-01-01: 300 ug via INTRAVENOUS
  Filled 2024-01-01: qty 2

## 2024-01-01 MED ORDER — IBUPROFEN 600 MG PO TABS
600.0000 mg | ORAL_TABLET | Freq: Four times a day (QID) | ORAL | 0 refills | Status: AC
  Filled 2024-01-01: qty 30, 8d supply, fill #0

## 2024-01-01 MED ORDER — ACETAMINOPHEN 500 MG PO TABS
1000.0000 mg | ORAL_TABLET | Freq: Four times a day (QID) | ORAL | 0 refills | Status: AC | PRN
  Filled 2024-01-01: qty 30, 4d supply, fill #0

## 2024-01-01 MED ORDER — OXYCODONE HCL 5 MG PO TABS
5.0000 mg | ORAL_TABLET | Freq: Four times a day (QID) | ORAL | 0 refills | Status: AC | PRN
  Filled 2024-01-01: qty 20, 5d supply, fill #0

## 2024-01-01 NOTE — Clinical Social Work Maternal (Signed)
 CLINICAL SOCIAL WORK MATERNAL/CHILD NOTE  Patient Details  Name: Mary [name] MRN: [id number] Date of Birth: 02-Sep-1990  Date:  01/01/2024  Clinical Social Worker Initiating Note:  Corrie Ruts Date/Time: Initiated:  01/01/24/1030     Child's Name:  Mary [name]   Biological Parents:  Mother, Father   Need for Interpreter:  None   Reason for Referral:      Address:  [address] [place] [address]    Phone number:  [phone] (home)     Additional phone number:   Household Members/Support Persons (HM/SP):   Household Member/Support Person 1, Household Member/Support Person 4, Household Member/Support Person 7, Household Member/Support Person 2, Household Member/Support Person 5, Household Member/Support Person 3, Household Member/Support Person 6   HM/SP Name Relationship DOB or Age  HM/SP -1 Mary [name] Father of the baby    HM/SP -2 Mary [name] Patient daughter 49  HM/SP -3 Mary [name] Patient son 38  HM/SP -4 Mary [name] Patient son 10  HM/SP -5 Mary [name] Patient son 8  HM/SP -6 Mary [name] Patient son 6  HM/SP -7 Mary [name] patient son 2  HM/SP -8          Natural Supports (not living in the home):  Immediate Family, Parent, Friends   Herbalist:     Employment: Unemployed   Type of Work:     Education:  9 to 11 years   Homebound arranged: No  Financial Resources:  [employer]   Other Resources:  Archivist Considerations Which May Impact Care:    Strengths:  Ability to meet basic needs  , Home prepared for child  , Compliance with medical plan  , Pediatrician chosen   Psychotropic Medications:         Pediatrician:    Merchant navy officer List:   Radiographer, therapeutic    [place] Other ([facility])  [facility]      Pediatrician Fax Number:    Risk Factors/Current Problems:  Substance  Use     Cognitive State:  Alert     Mood/Affect:  Calm     CSW Assessment:  Chart reviewed. I spoke with the patient and the father of the baby at bedside today. I introduced myself, my role, reason for consult, and HIPAA. The patient consented for the FOB to stay in the room. I consulted with the patient about positive [facility] toxicology screen.  The patient reports that she felt good after birth. The patient confirmed that her address is [address] [place] [address] and her telephone number is [phone]. The patient confirmed that the father of the baby is [employer]. The patient reports that she has support from her parents and baby god parents. The patient report that she lives in the home with the father of her baby and her children.   The patient reports that her highest level of education is 9th grade and she is currently unemployed. The patient reports that she receives [facility] and [employer]. The patient reports that she has depression, insomnia, and anxiety  during pregnancy and she was seeing thr OBGYN. The patient reports that she takes Lexipro but kind of unsure the same of the medication. The patient reports that she is not active in therapy. The patient denies any past and current SI/HI/DV during the time of  the assessment.   The patient reports that she does not have a PCP and would like a list of PCP providers. I will add resources to patient AVS. The patient reports that the pediatrician for the baby is [facility] in [place]. The patient reports that she will be breast feeding and has a breast pump.   The patient reports that she has a bassinet, clothes, diapers, and car seat.  I reviewed information on Post partum Depression, Sudden Infant Death Syndrome, Car seat safety, Safe sleep environment, behavioral health information, post partum community resources, and [facility] use while breast feeding. The patient verbalized understanding.   I informed the patient  about the law and hospital policy about drug exposure to children. I explained that a CPS report is mandated. The patient verbalized understanding. The patient reports that she has a history of sing only THC and she last used THC 3 weeks ago. The patient reports that she has a history of CPS involvement. The patient reports that the last involvement was 3 months ago but no open cases.  The patient tested positive for [facility] during admission 9/18. I will make a CPS report to [place] DSS.   TOC will follow the patient and the baby until discharge.    CSW Plan/Description:  Sudden Infant Death Syndrome (SIDS) Education, Perinatal Mood and Anxiety Disorder (PMADs) Education, Hospital Drug Screen Policy Information, Child Protective Service Report      Corrie JINNY Marcus KEN 01/01/2024, 12:35 PM

## 2024-01-01 NOTE — Anesthesia Postprocedure Evaluation (Signed)
 Anesthesia Post Note  Patient: Mary [name]  Procedure(s) Performed: AN AD HOC LABOR EPIDURAL  Patient location during evaluation: Mother Baby Anesthesia Type: Epidural Level of consciousness: awake and alert Pain management: pain level controlled Vital Signs Assessment: post-procedure vital signs reviewed and stable Respiratory status: spontaneous breathing, nonlabored ventilation and respiratory function stable Cardiovascular status: stable Postop Assessment: no headache, no backache and epidural receding Anesthetic complications: no   No notable events documented.   Last Vitals:  Vitals:   01/01/24 0004 01/01/24 0728  BP: 111/72 116/87  Pulse: 76 78  Resp: 18 18  Temp: 36.8 C 36.6 C  SpO2: 97% 100%    Last Pain:  Vitals:   01/01/24 0728  TempSrc: Oral  PainSc:                  Madalyn Hammock

## 2024-01-01 NOTE — TOC Progression Note (Signed)
 Transition of Care [facility]) - Progression Note    Patient Details  Name: Mary [name] MRN: [id number] Date of Birth: [birth year]  [facility] CM/SW Contact  K'La JINNY Ruts, LCSW Phone Number: 01/01/2024, 3:25 PM  Clinical Narrative:    Chart reviewed. CPS report made with Ellouise Reese at [facility].                      Expected Discharge Plan and Services                                               Social Drivers of Health (SDOH) Interventions SDOH Screenings   Food Insecurity: No Food Insecurity (12/30/2023)  Housing: Low Risk  (12/30/2023)  Transportation Needs: No Transportation Needs (12/30/2023)  Utilities: Not At Risk (12/30/2023)  Alcohol Screen: Low Risk  (06/26/2023)  Depression (PHQ2-9): Low Risk  (06/26/2023)  Stress: No Stress Concern Present (06/26/2023)  Tobacco Use: Medium Risk (12/30/2023)  Health Literacy: Adequate Health Literacy (06/26/2023)    Readmission Risk Interventions     No data to display

## 2024-01-01 NOTE — Discharge Instructions (Signed)
 Some PCP options in [place] area- not a comprehensive list  [facility]- [phone] [facility]- [phone] [facility]- [phone] [facility]- [phone] [facility]- [phone] Nichole Molly- [phone]  or [facility] Physician Referral Line [phone]

## 2024-01-02 LAB — RHOGAM INJECTION: Unit division: 0

## 2024-01-04 ENCOUNTER — Other Ambulatory Visit

## 2024-01-04 ENCOUNTER — Encounter: Admitting: Obstetrics

## 2024-01-12 ENCOUNTER — Encounter: Payer: Self-pay | Admitting: Certified Nurse Midwife

## 2024-01-12 NOTE — Progress Notes (Deleted)
   Virtual Visit via Video Note  I connected with Mary [name] on 01/12/24 at  10:15 AM EDT by a video enabled telemedicine application and verified that I am speaking with the correct person using two identifiers.  Location: Patient: *** Provider: ***   I discussed the limitations of evaluation and management by telemedicine and the availability of in person appointments. The patient expressed understanding and agreed to proceed.    History of Present Illness:   Mary [name] is a 33 y.o. [phone] female who presents for a 2 week televisit for mood check. She is 2 weeks postpartum following a ***spontaneous vaginal/cesarean delivery.  The delivery was at {Numbers; 0-40:17906} gestational weeks.  Postpartum course has been well so far. Baby is feeding by ***. Bleeding: ***. Postpartum depression screening: {Desc; negative/positive:13464}.  EDPS score is ***.      The following portions of the patient's history were reviewed and updated as appropriate: allergies, current medications, past family history, past medical history, past social history, past surgical history, and problem list.   Observations/Objective:   Last menstrual period 02/18/2023, unknown if currently breastfeeding. Gen App: NAD Psych: normal speech, affect. Good mood.        12/31/2023   11:51 AM 11/11/2023    4:09 PM 11/03/2023   11:38 AM 09/24/2021    9:00 AM  Edinburgh Postnatal Depression Scale Screening Tool  I have been able to laugh and see the funny side of things. 0 1 0 0  I have looked forward with enjoyment to things. 1 1 1  0  I have blamed myself unnecessarily when things went wrong. 2 1 1  0  I have been anxious or worried for no good reason. 1 2 2  0  I have felt scared or panicky for no good reason. 1 1 2  0  Things have been getting on top of me. 1 1 1 1   I have been so unhappy that I have had difficulty sleeping. 0 1 1 0  I have felt sad or miserable. 0 1 1 0  I have been so  unhappy that I have been crying. 1 1 1 1   The thought of harming myself has occurred to me. 0 0 0 0  Edinburgh Postnatal Depression Scale Total 7 10 10 2       Data saved with a previous flowsheet row definition         Assessment and Plan:   1. 2 weeks postpartum follow-up (Primary)  2. Postpartum care following vaginal delivery    Follow Up Instructions:     I discussed the assessment and treatment plan with the patient. The patient was provided an opportunity to ask questions and all were answered. The patient agreed with the plan and demonstrated an understanding of the instructions.   The patient was advised to call back or seek an in-person evaluation if the symptoms worsen or if the condition fails to improve as anticipated.   Mathis LITTIE Getting, CMA

## 2024-01-14 ENCOUNTER — Telehealth: Admitting: Certified Nurse Midwife

## 2024-02-04 NOTE — Progress Notes (Deleted)
    Post Partum Visit Note  Mary [name] is a 33 y.o. [phone] female who presents for a postpartum visit. She is 5 weeks postpartum following a normal spontaneous vaginal delivery.  I have fully reviewed the prenatal and intrapartum course. The delivery was at 40.2 gestational weeks.  Anesthesia: epidural. Postpartum course has been ***. Baby is doing well***. Baby is feeding by {breastmilk/bottle:69}. Bleeding {vag bleed:12292}. Bowel function is {normal:32111}. Bladder function is {normal:32111}. Patient {is/is not:9024} sexually active. Contraception method is {contraceptive method:5051}. Postpartum depression screening: {gen negative/positive:315881}.   The pregnancy intention screening data noted above was reviewed. Potential methods of contraception were discussed. The patient elected to proceed with No data recorded.    Health Maintenance Due  Topic Date Due   Hepatitis B Vaccines 19-59 Average Risk (1 of 3 - 19+ 3-dose series) Never done   HPV VACCINES (1 - 3-dose SCDM series) Never done   COVID-19 Vaccine (1 - 2025-26 season) Never done    {Common ambulatory SmartLinks:19316}  Review of Systems {ros; complete:30496}  Objective:  LMP 02/18/2023 (Approximate)    General:  {gen appearance:16600}   Breasts:  {desc; normal/abnormal/not indicated:14647}  Lungs: {lung exam:16931}  Heart:  {heart exam:5510}  Abdomen: {abdomen exam:16834}   Wound {Wound assessment:11097}  GU exam:  {desc; normal/abnormal/not indicated:14647}       Assessment:    1. Postpartum care following vaginal delivery ***  2. Encounter for screening for maternal depression ***   *** postpartum exam.   Plan:   Essential components of care per ACOG recommendations:  1.  Mood and well being: Patient with {gen negative/positive:315881} depression screening today. Reviewed local resources for support.  - Patient tobacco use? {tobacco use:25506}  - hx of drug use? {yes/no:25505}    2.  Infant care and feeding:  -Patient currently breastmilk feeding? {yes/no:25502}  -Social determinants of health (SDOH) reviewed in EPIC. No concerns***The following needs were identified***  3. Sexuality, contraception and birth spacing - Patient {DOES_DOES WNU:81435} want a pregnancy in the next year.  Desired family size is {NUMBER 1-10:22536} children.  - Reviewed reproductive life planning. Reviewed contraceptive methods based on pt preferences and effectiveness.  Patient desired {Upstream End Methods:24109} today.   - Discussed birth spacing of 18 months  4. Sleep and fatigue -Encouraged family/partner/community support of 4 hrs of uninterrupted sleep to help with mood and fatigue  5. Physical Recovery  - Discussed patients delivery and complications. She describes her labor as {description:25511} - Patient had a {CHL AMB DELIVERY:[phone]}. Patient had a {laceration:25518} laceration. Perineal healing reviewed. Patient expressed understanding - Patient has urinary incontinence? {yes/no:25515} - Patient {ACTION; IS/IS WNU:78978602} safe to resume physical and sexual activity  6.  Health Maintenance - HM due items addressed {Yes or If no, why not?:20788} - Last pap smear  Diagnosis  Date Value Ref Range Status  04/26/2021   Final   - Negative for intraepithelial lesion or malignancy (NILM)   Pap smear {done:10129} at today's visit.  -Breast Cancer screening indicated? {indicated:25516}  7. Chronic Disease/Pregnancy Condition follow up: {Follow up:25499}  - PCP follow up  Camelia Bars, LPN [facility], [facility]

## 2024-02-09 ENCOUNTER — Ambulatory Visit: Admitting: Certified Nurse Midwife

## 2024-02-09 DIAGNOSIS — Z1332 Encounter for screening for maternal depression: Secondary | ICD-10-CM
